# Patient Record
Sex: Female | Born: 1960 | Race: White | Hispanic: No | Marital: Married | Smoking: Never smoker
Health system: Southern US, Community
[De-identification: ages and names within clinical notes are randomized; demographics above are authoritative.]

## PROBLEM LIST (undated history)

## (undated) DIAGNOSIS — M1990 Unspecified osteoarthritis, unspecified site: Secondary | ICD-10-CM

## (undated) DIAGNOSIS — N189 Chronic kidney disease, unspecified: Secondary | ICD-10-CM

## (undated) DIAGNOSIS — T7840XA Allergy, unspecified, initial encounter: Secondary | ICD-10-CM

## (undated) DIAGNOSIS — E079 Disorder of thyroid, unspecified: Secondary | ICD-10-CM

## (undated) HISTORY — DX: Disorder of thyroid, unspecified: E07.9

## (undated) HISTORY — PX: THYROIDECTOMY: SHX17

## (undated) HISTORY — DX: Allergy, unspecified, initial encounter: T78.40XA

## (undated) HISTORY — PX: TOOTH EXTRACTION: SUR596

## (undated) HISTORY — DX: Chronic kidney disease, unspecified: N18.9

## (undated) HISTORY — DX: Unspecified osteoarthritis, unspecified site: M19.90

---

## 2015-04-27 DIAGNOSIS — M169 Osteoarthritis of hip, unspecified: Secondary | ICD-10-CM | POA: Insufficient documentation

## 2015-04-27 DIAGNOSIS — S91309A Unspecified open wound, unspecified foot, initial encounter: Secondary | ICD-10-CM | POA: Insufficient documentation

## 2015-12-19 DIAGNOSIS — L84 Corns and callosities: Secondary | ICD-10-CM | POA: Insufficient documentation

## 2018-10-24 DIAGNOSIS — E890 Postprocedural hypothyroidism: Secondary | ICD-10-CM | POA: Insufficient documentation

## 2018-10-29 ENCOUNTER — Other Ambulatory Visit: Payer: Self-pay | Admitting: Internal Medicine

## 2018-10-29 DIAGNOSIS — N289 Disorder of kidney and ureter, unspecified: Secondary | ICD-10-CM

## 2018-11-04 ENCOUNTER — Ambulatory Visit
Admission: RE | Admit: 2018-11-04 | Discharge: 2018-11-04 | Disposition: A | Discharge status: Home / Self Care | Payer: Medicare Other | Source: Ambulatory Visit | Attending: Internal Medicine | Admitting: Internal Medicine

## 2018-11-04 DIAGNOSIS — N289 Disorder of kidney and ureter, unspecified: Secondary | ICD-10-CM | POA: Insufficient documentation

## 2018-12-09 DIAGNOSIS — G8929 Other chronic pain: Secondary | ICD-10-CM | POA: Insufficient documentation

## 2018-12-09 DIAGNOSIS — M545 Low back pain, unspecified: Secondary | ICD-10-CM | POA: Insufficient documentation

## 2018-12-09 DIAGNOSIS — N183 Chronic kidney disease, stage 3 unspecified: Secondary | ICD-10-CM | POA: Insufficient documentation

## 2018-12-19 ENCOUNTER — Other Ambulatory Visit: Payer: Self-pay | Admitting: Physician Assistant

## 2018-12-19 DIAGNOSIS — Z1231 Encounter for screening mammogram for malignant neoplasm of breast: Secondary | ICD-10-CM

## 2019-01-07 DIAGNOSIS — M7630 Iliotibial band syndrome, unspecified leg: Secondary | ICD-10-CM | POA: Insufficient documentation

## 2019-01-09 ENCOUNTER — Emergency Department
Admission: EM | Admit: 2019-01-09 | Discharge: 2019-01-09 | Discharge status: Against Medical Advice | Payer: Medicare Other | Attending: Emergency Medicine | Admitting: Emergency Medicine

## 2019-01-09 ENCOUNTER — Other Ambulatory Visit: Payer: Self-pay

## 2019-01-09 ENCOUNTER — Encounter: Payer: Self-pay | Admitting: Emergency Medicine

## 2019-01-09 DIAGNOSIS — Z5321 Procedure and treatment not carried out due to patient leaving prior to being seen by health care provider: Secondary | ICD-10-CM | POA: Diagnosis not present

## 2019-01-09 DIAGNOSIS — R079 Chest pain, unspecified: Secondary | ICD-10-CM | POA: Insufficient documentation

## 2019-01-09 LAB — COMPREHENSIVE METABOLIC PANEL
ALT: 9 U/L (ref 0–44)
ANION GAP: 8 (ref 5–15)
AST: 19 U/L (ref 15–41)
Albumin: 4 g/dL (ref 3.5–5.0)
Alkaline Phosphatase: 65 U/L (ref 38–126)
BUN: 21 mg/dL — ABNORMAL HIGH (ref 6–20)
CO2: 28 mmol/L (ref 22–32)
Calcium: 9.1 mg/dL (ref 8.9–10.3)
Chloride: 102 mmol/L (ref 98–111)
Creatinine, Ser: 1.12 mg/dL — ABNORMAL HIGH (ref 0.44–1.00)
GFR calc Af Amer: >60 mL/min (ref >60)
GFR calc non Af Amer: 55 mL/min — ABNORMAL LOW (ref >60)
Glucose, Bld: 149 mg/dL — ABNORMAL HIGH (ref 70–99)
Potassium: 3.9 mmol/L (ref 3.5–5.1)
Sodium: 138 mmol/L (ref 135–145)
TOTAL PROTEIN: 7 g/dL (ref 6.5–8.1)
Total Bilirubin: 0.5 mg/dL (ref 0.3–1.2)

## 2019-01-09 LAB — CBC WITH DIFFERENTIAL/PLATELET
Abs Immature Granulocytes: 0.01 10*3/uL (ref 0.00–0.07)
BASOS ABS: 0.1 10*3/uL (ref 0.0–0.1)
Basophils Relative: 1 %
Eosinophils Absolute: 0 10*3/uL (ref 0.0–0.5)
Eosinophils Relative: 1 %
HEMATOCRIT: 37.6 % (ref 36.0–46.0)
Hemoglobin: 12.9 g/dL (ref 12.0–15.0)
Immature Granulocytes: 0 %
Lymphocytes Relative: 48 %
Lymphs Abs: 3.4 10*3/uL (ref 0.7–4.0)
MCH: 28 pg (ref 26.0–34.0)
MCHC: 34.3 g/dL (ref 30.0–36.0)
MCV: 81.6 fL (ref 80.0–100.0)
Monocytes Absolute: 0.5 10*3/uL (ref 0.1–1.0)
Monocytes Relative: 7 %
Neutro Abs: 2.9 10*3/uL (ref 1.7–7.7)
Neutrophils Relative %: 43 %
Platelets: 178 10*3/uL (ref 150–400)
RBC: 4.61 MIL/uL (ref 3.87–5.11)
RDW: 12.6 % (ref 11.5–15.5)
WBC: 6.9 10*3/uL (ref 4.0–10.5)
nRBC: 0 % (ref 0.0–0.2)

## 2019-01-09 LAB — TROPONIN I: Troponin I: <0.03 ng/mL (ref <0.03)

## 2019-01-09 NOTE — ED Notes (Signed)
Pt ambulatory to STAT desk without difficulty or distress noted; expresses desire to leave now and f/u in morning; informed of importance of rechecking labs for any changes and need to be eval by provider; pt st she lives "just around the corner" and will return for any new or worsening symptoms tonight but will call PCP in morning for f/u

## 2019-01-09 NOTE — ED Triage Notes (Signed)
Patient ambulatory to triage with steady gait, without difficulty or distress noted; pt reports left side CP that radiating into back; took 81mg  ASA that relieved CP but not back pain accomp by nausea; denies hx of same; seen at [facility] earlier for EKG, labs, and CXR and told needs a stress test

## 2019-09-02 ENCOUNTER — Ambulatory Visit
Admission: RE | Admit: 2019-09-02 | Discharge: 2019-09-02 | Disposition: A | Discharge status: Home / Self Care | Payer: Medicare Other | Source: Ambulatory Visit | Attending: Physician Assistant | Admitting: Physician Assistant

## 2019-09-02 DIAGNOSIS — Z1231 Encounter for screening mammogram for malignant neoplasm of breast: Secondary | ICD-10-CM | POA: Diagnosis not present

## 2020-08-09 ENCOUNTER — Other Ambulatory Visit: Payer: Self-pay | Admitting: Physician Assistant

## 2020-08-09 DIAGNOSIS — Z1231 Encounter for screening mammogram for malignant neoplasm of breast: Secondary | ICD-10-CM

## 2020-08-17 LAB — EXTERNAL GENERIC LAB PROCEDURE: COLOGUARD: NEGATIVE

## 2020-09-10 ENCOUNTER — Ambulatory Visit
Admission: RE | Admit: 2020-09-10 | Discharge: 2020-09-10 | Disposition: A | Discharge status: Home / Self Care | Payer: Medicare Other | Source: Ambulatory Visit | Attending: Physician Assistant | Admitting: Physician Assistant

## 2020-09-10 ENCOUNTER — Other Ambulatory Visit: Payer: Self-pay

## 2020-09-10 DIAGNOSIS — Z1231 Encounter for screening mammogram for malignant neoplasm of breast: Secondary | ICD-10-CM

## 2021-08-03 ENCOUNTER — Other Ambulatory Visit: Payer: Self-pay | Admitting: Physician Assistant

## 2021-08-03 DIAGNOSIS — Z1231 Encounter for screening mammogram for malignant neoplasm of breast: Secondary | ICD-10-CM

## 2021-12-14 ENCOUNTER — Ambulatory Visit
Admission: RE | Admit: 2021-12-14 | Discharge: 2021-12-14 | Disposition: A | Discharge status: Home / Self Care | Payer: Medicare Other | Source: Ambulatory Visit | Attending: Physician Assistant | Admitting: Physician Assistant

## 2021-12-14 ENCOUNTER — Other Ambulatory Visit: Payer: Self-pay

## 2021-12-14 DIAGNOSIS — Z1231 Encounter for screening mammogram for malignant neoplasm of breast: Secondary | ICD-10-CM | POA: Diagnosis not present

## 2022-08-08 ENCOUNTER — Other Ambulatory Visit: Payer: Self-pay | Admitting: Physician Assistant

## 2022-08-08 DIAGNOSIS — Z1231 Encounter for screening mammogram for malignant neoplasm of breast: Secondary | ICD-10-CM

## 2022-10-26 ENCOUNTER — Encounter: Payer: Self-pay | Admitting: Otolaryngology

## 2022-10-26 DIAGNOSIS — K118 Other diseases of salivary glands: Secondary | ICD-10-CM

## 2022-10-26 DIAGNOSIS — H9201 Otalgia, right ear: Secondary | ICD-10-CM

## 2022-10-27 ENCOUNTER — Other Ambulatory Visit: Payer: Self-pay | Admitting: Otolaryngology

## 2022-10-27 DIAGNOSIS — H9201 Otalgia, right ear: Secondary | ICD-10-CM

## 2022-10-27 DIAGNOSIS — K1120 Sialoadenitis, unspecified: Secondary | ICD-10-CM

## 2022-12-28 ENCOUNTER — Ambulatory Visit
Admission: RE | Admit: 2022-12-28 | Discharge: 2022-12-28 | Disposition: A | Discharge status: Home / Self Care | Payer: Medicare Other | Source: Ambulatory Visit | Attending: Physician Assistant | Admitting: Physician Assistant

## 2022-12-28 DIAGNOSIS — Z1231 Encounter for screening mammogram for malignant neoplasm of breast: Secondary | ICD-10-CM | POA: Diagnosis present

## 2023-02-26 IMAGING — MG MM DIGITAL SCREENING BILAT W/ TOMO AND CAD
8 series · 9 of 24 positions shown · non-contrast
Comparison: Previous exam(s).

CLINICAL DATA: Screening.

EXAM:
DIGITAL SCREENING BILATERAL MAMMOGRAM WITH TOMOSYNTHESIS AND CAD
TECHNIQUE: Bilateral screening digital craniocaudal and mediolateral oblique
mammograms were obtained. Bilateral screening digital breast
tomosynthesis was performed. The images were evaluated with
computer-aided detection.

[R CC synth-2D]
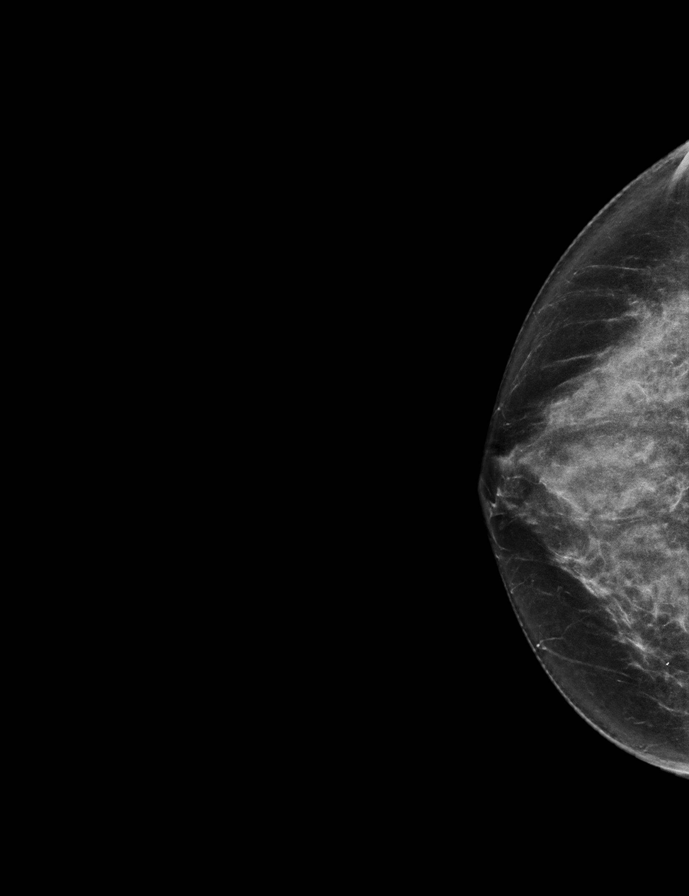

[R MLO synth-2D]
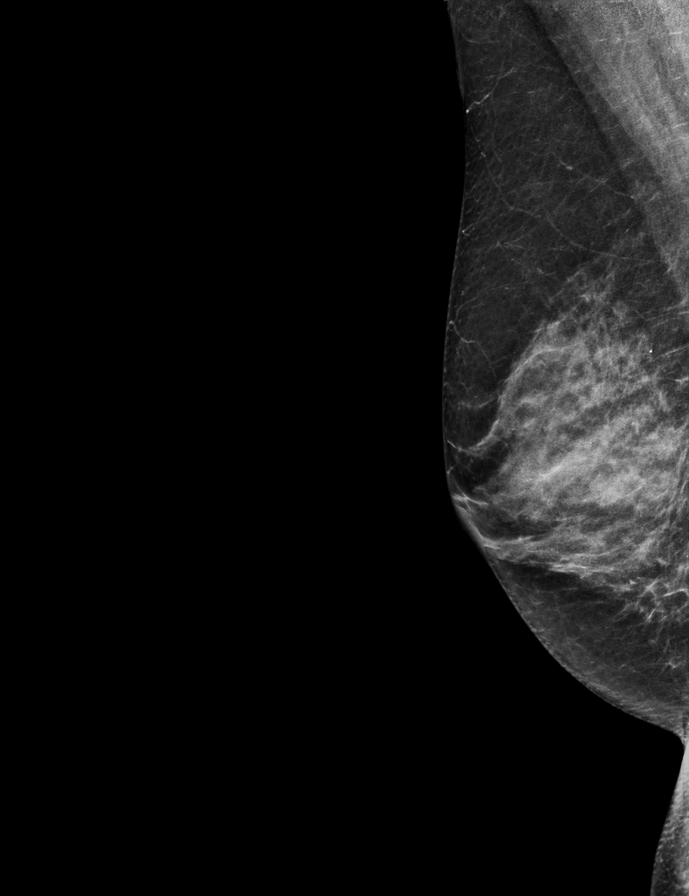

[L CC synth-2D]
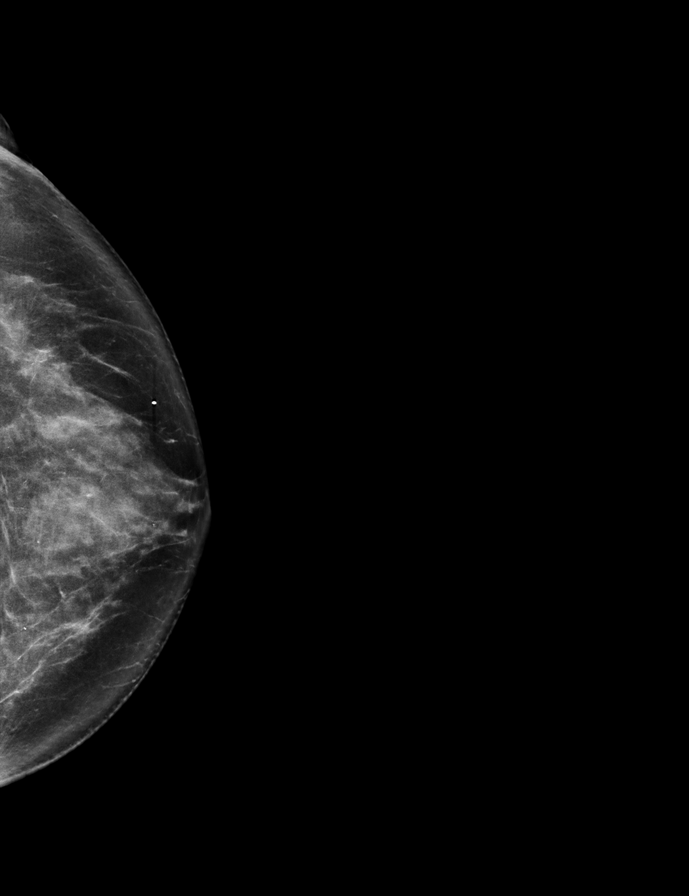

[L MLO synth-2D]
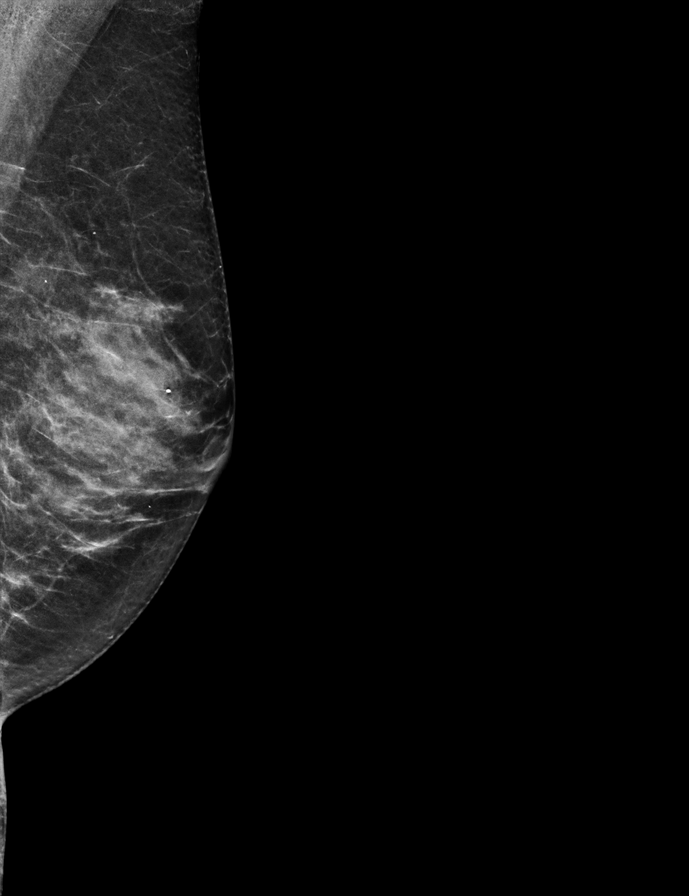

[L CC tomo · 2 of 73 frames shown]
[frame 24/73]
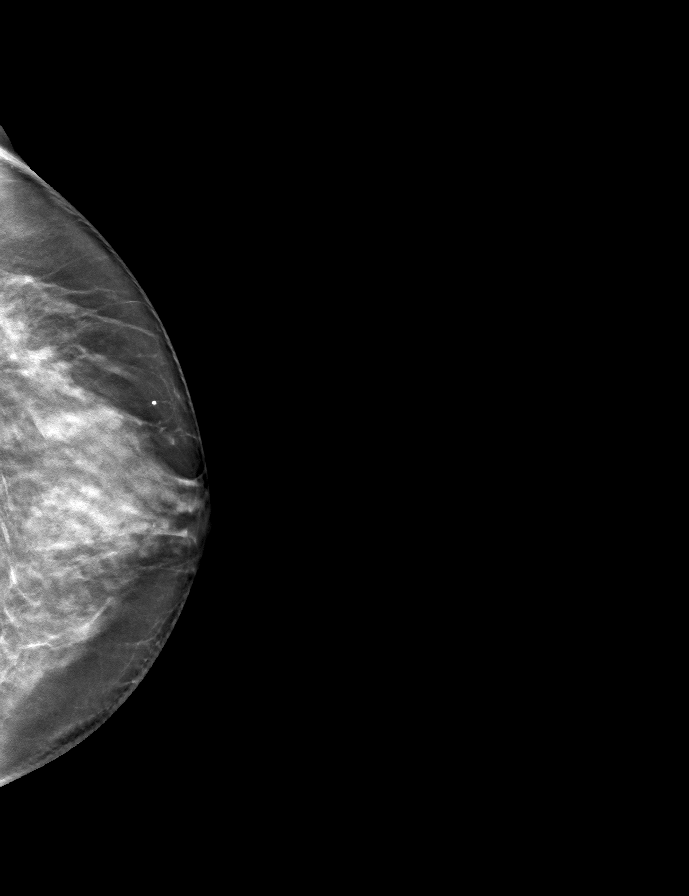
[frame 37/73]
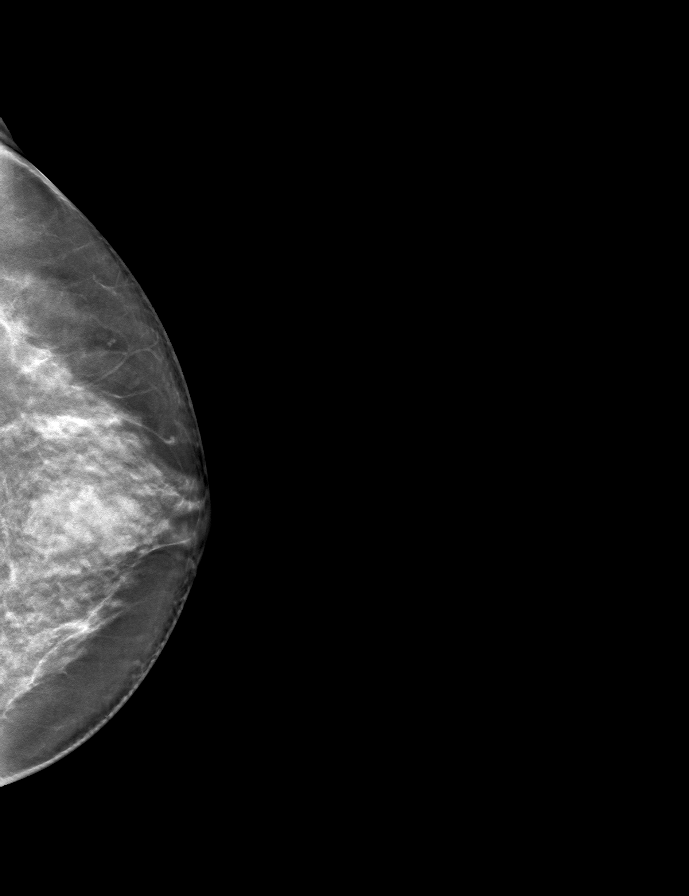

[L MLO tomo · tomo slice 30/59.0]
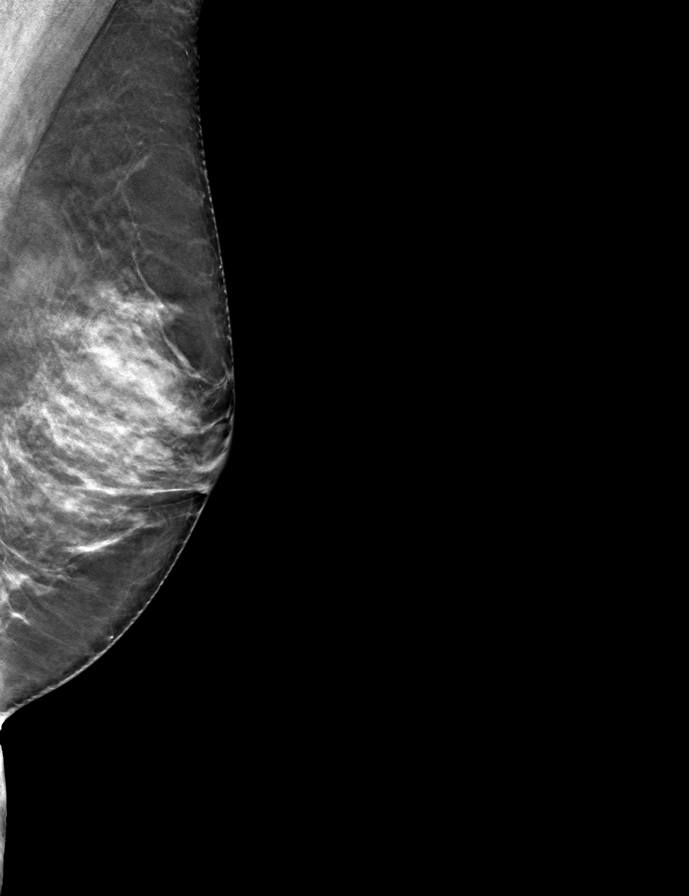

[R MLO tomo · tomo slice 31/60.0]
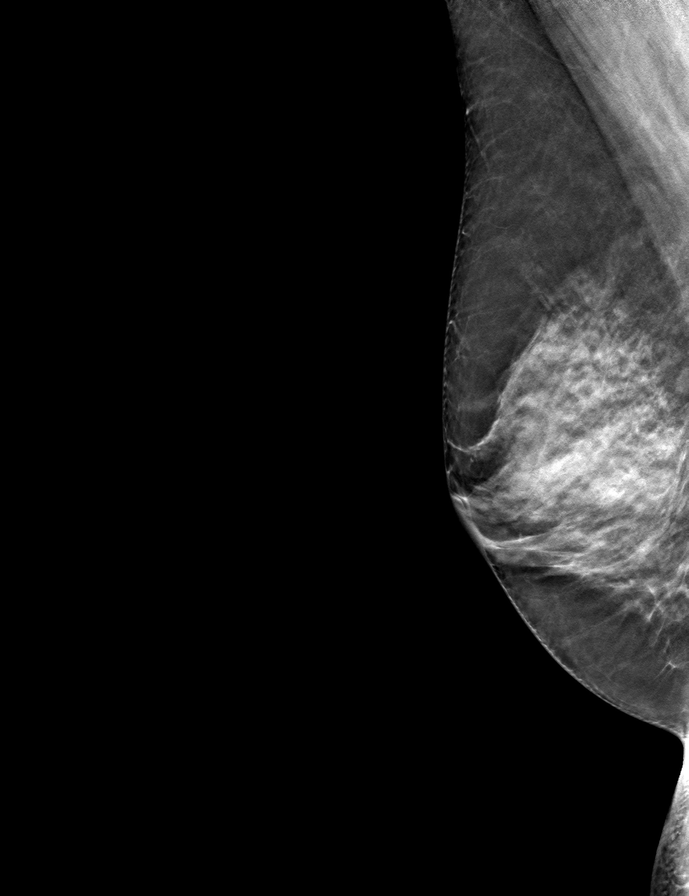

[R CC tomo · tomo slice 35/68.0]
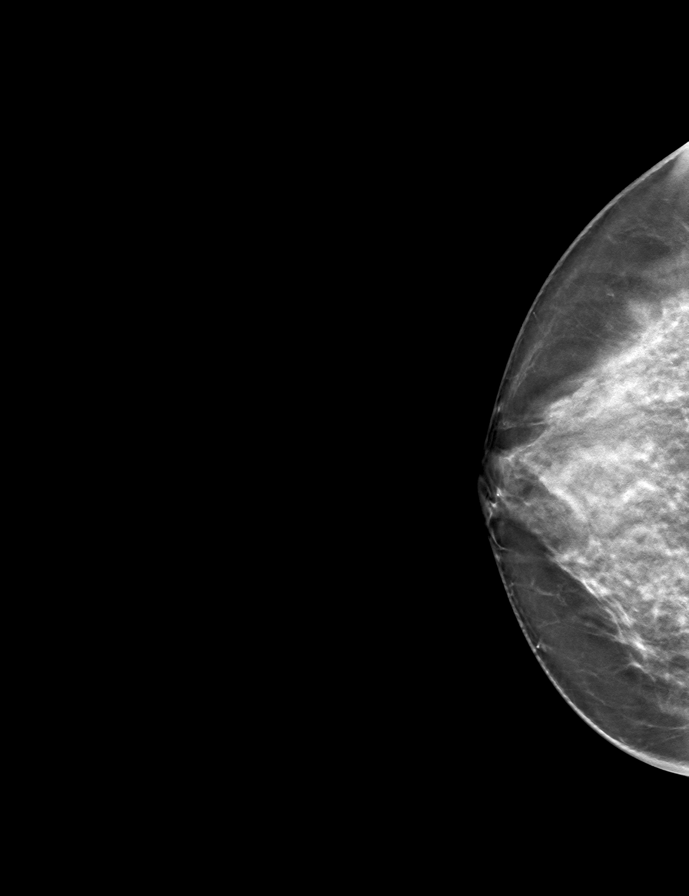

[9 of 24 positions shown; findings below may reference images not displayed]

ACR Breast Density Category d: The breast tissue is extremely dense,
which lowers the sensitivity of mammography
FINDINGS: There are no findings suspicious for malignancy.
IMPRESSION: No mammographic evidence of malignancy. A result letter of this
screening mammogram will be mailed directly to the patient.

RECOMMENDATION:
Screening mammogram in one year. (Code:TA-V-WV9)

BI-RADS CATEGORY  1: Negative.

## 2023-05-16 ENCOUNTER — Other Ambulatory Visit: Payer: Self-pay | Admitting: Neurology

## 2023-05-16 DIAGNOSIS — R519 Headache, unspecified: Secondary | ICD-10-CM

## 2023-05-26 ENCOUNTER — Ambulatory Visit
Admission: RE | Admit: 2023-05-26 | Discharge: 2023-05-26 | Disposition: A | Discharge status: Home / Self Care | Payer: Medicare Other | Source: Ambulatory Visit | Attending: Neurology | Admitting: Neurology

## 2023-05-26 DIAGNOSIS — R519 Headache, unspecified: Secondary | ICD-10-CM

## 2023-08-08 ENCOUNTER — Emergency Department
Admission: EM | Admit: 2023-08-08 | Discharge: 2023-08-09 | Disposition: A | Discharge status: Home / Self Care | Payer: Medicare Other | Attending: Emergency Medicine | Admitting: Emergency Medicine

## 2023-08-08 ENCOUNTER — Other Ambulatory Visit: Payer: Self-pay

## 2023-08-08 DIAGNOSIS — R300 Dysuria: Secondary | ICD-10-CM | POA: Insufficient documentation

## 2023-08-08 NOTE — ED Triage Notes (Signed)
Patient ambulatory to triage with complaints of burning when she pees. She states she was seen here 2 days ago but had negative UA. Patient has recently been on 2 different antibiotics after dental surgery.

## 2023-08-08 NOTE — ED Provider Notes (Signed)
[facility] Provider Note    Event Date/Time   First MD Initiated Contact with Patient 08/08/23 2328     (approximate)   History   Dysuria   HPI  Jamie Walker is a 62 y.o. female who presents to the ED for evaluation of Dysuria   Reviewed PCP visit from 10 days ago.  Seen for dental pain, diagnosed with dental abscess.  Previously prescribed amoxicillin from dentist but switched to Augmentin for 10 days, set to finish today.  Patient presents to the ED alongside her husband for evaluation of dysuria at the end of urination that occurred just tonight in the past hour or 2 with 1 void.  She reports she had urinary frequency 4 days ago transiently.  Regarding her Augmentin, she only took 3 doses and then stopped  No abdominal pain, emesis, fever, stool changes, vaginal discharge  Physical Exam   Triage Vital Signs: ED Triage Vitals  Encounter Vitals Group     BP 08/08/23 2324 (!) 174/92     Systolic BP Percentile --      Diastolic BP Percentile --      Pulse Rate 08/08/23 2324 85     Resp 08/08/23 2324 18     Temp 08/08/23 2324 97.6 F (36.4 C)     Temp src --      SpO2 08/08/23 2324 100 %     Weight 08/08/23 2323 116 lb (52.6 kg)     Height 08/08/23 2323 5\' 4"  (1.626 m)     Head Circumference --      Peak Flow --      Pain Score 08/08/23 2323 6     Pain Loc --      Pain Education --      Exclude from Growth Chart --     Most recent vital signs: Vitals:   08/08/23 2324  BP: (!) 174/92  Pulse: 85  Resp: 18  Temp: 97.6 F (36.4 C)  SpO2: 100%    General: Awake, no distress.  CV:  Good peripheral perfusion.  Resp:  Normal effort.  Abd:  No distention.  Soft and benign throughout MSK:  No deformity noted.  Neuro:  No focal deficits appreciated. Other:     ED Results / Procedures / Treatments   Labs (all labs ordered are listed, but only abnormal results are displayed) Labs Reviewed  URINALYSIS, ROUTINE W REFLEX  MICROSCOPIC - Abnormal; Notable for the following components:      Result Value   Color, Urine COLORLESS (*)    APPearance CLEAR (*)    Specific Gravity, Urine 1.002 (*)    Hgb urine dipstick SMALL (*)    All other components within normal limits  URINE CULTURE    EKG   RADIOLOGY   Official radiology report(s): No results found.  PROCEDURES and INTERVENTIONS:  Procedures  Medications  phenazopyridine (PYRIDIUM) tablet 100 mg (100 mg Oral Given 08/09/23 0041)     IMPRESSION / MDM / ASSESSMENT AND PLAN / ED COURSE  I reviewed the triage vital signs and the nursing notes.  Differential diagnosis includes, but is not limited to, atrophic vaginitis, STI, PID, UTI, retention  Patient presents after an episode of dysuria with out evidence of a UTI and suitable for outpatient management.  Urine is clear without stigmata of infection.  Doubt ureteral stone.  We discussed the possibility of vaginitis.  We discussed treatment with Pyridium, pending urine culture and PCP follow-up.  Discussed ED return  precautions.  She is suitable for outpatient management.      FINAL CLINICAL IMPRESSION(S) / ED DIAGNOSES   Final diagnoses:  Dysuria     Rx / DC Orders   ED Discharge Orders     None        Note:  This document was prepared using Dragon voice recognition software and may include unintentional dictation errors.   Delton Prairie, MD 08/09/23 [phone]

## 2023-08-09 DIAGNOSIS — R300 Dysuria: Secondary | ICD-10-CM | POA: Diagnosis not present

## 2023-08-09 LAB — URINALYSIS, ROUTINE W REFLEX MICROSCOPIC
Bacteria, UA: NONE SEEN
Bilirubin Urine: NEGATIVE
Glucose, UA: NEGATIVE mg/dL
Ketones, ur: NEGATIVE mg/dL
Leukocytes,Ua: NEGATIVE
Nitrite: NEGATIVE
Protein, ur: NEGATIVE mg/dL
Specific Gravity, Urine: 1.002 — ABNORMAL LOW (ref 1.005–1.030)
Squamous Epithelial / HPF: 0 /[HPF] (ref 0–5)
pH: 6 (ref 5.0–8.0)

## 2023-08-09 MED ORDER — PHENAZOPYRIDINE HCL 100 MG PO TABS
95.0000 mg | ORAL_TABLET | Freq: Once | ORAL | Status: AC
  Administered 2023-08-09: 100 mg via ORAL
  Filled 2023-08-09: qty 1

## 2023-08-09 NOTE — ED Notes (Signed)
Patient complaining of burning when voiding.  Ambulated to the restroom independently.  Husband at the bedside.

## 2023-08-10 LAB — URINE CULTURE: Culture: <10000 — AB

## 2023-08-25 ENCOUNTER — Emergency Department
Admission: EM | Admit: 2023-08-25 | Discharge: 2023-08-25 | Disposition: A | Discharge status: Home / Self Care | Payer: Medicare Other | Attending: Emergency Medicine | Admitting: Emergency Medicine

## 2023-08-25 ENCOUNTER — Other Ambulatory Visit: Payer: Self-pay

## 2023-08-25 DIAGNOSIS — G500 Trigeminal neuralgia: Secondary | ICD-10-CM | POA: Diagnosis not present

## 2023-08-25 DIAGNOSIS — R519 Headache, unspecified: Secondary | ICD-10-CM | POA: Diagnosis present

## 2023-08-25 MED ORDER — KETOROLAC TROMETHAMINE 30 MG/ML IJ SOLN
30.0000 mg | Freq: Once | INTRAMUSCULAR | Status: AC
  Administered 2023-08-25: 30 mg via INTRAVENOUS
  Filled 2023-08-25: qty 1

## 2023-08-25 MED ORDER — CELECOXIB 100 MG PO CAPS: 100.0000 mg | ORAL_CAPSULE | Freq: Two times a day (BID) | ORAL | 2 refills | Status: DC

## 2023-08-25 MED ORDER — ONDANSETRON HCL 4 MG/2ML IJ SOLN
4.0000 mg | Freq: Once | INTRAMUSCULAR | Status: AC
  Administered 2023-08-25: 4 mg via INTRAVENOUS
  Filled 2023-08-25: qty 2

## 2023-08-25 MED ORDER — DIPHENHYDRAMINE HCL 50 MG/ML IJ SOLN
25.0000 mg | Freq: Once | INTRAMUSCULAR | Status: AC
  Administered 2023-08-25: 25 mg via INTRAVENOUS
  Filled 2023-08-25: qty 1

## 2023-08-25 MED ORDER — TRAMADOL HCL 50 MG PO TABS: 50.0000 mg | ORAL_TABLET | Freq: Four times a day (QID) | ORAL | 0 refills | Status: DC | PRN

## 2023-08-25 MED ORDER — MORPHINE SULFATE (PF) 4 MG/ML IV SOLN
4.0000 mg | Freq: Once | INTRAVENOUS | Status: AC
  Administered 2023-08-25: 4 mg via INTRAVENOUS
  Filled 2023-08-25: qty 1

## 2023-08-25 NOTE — ED Provider Notes (Signed)
[facility] Provider Note    Event Date/Time   First MD Initiated Contact with Patient 08/25/23 [phone]     (approximate)   History   Dental Pain   HPI  Jamie Walker is a 62 y.o. female with fairly extensive history of right sided facial pain complicated by extraction performed several weeks ago which seem to have helped initial pain but is created a new ongoing pain.  She has seen multiple specialist for this including 2 oral surgeons who have recommended pain management     Physical Exam   Triage Vital Signs: ED Triage Vitals [08/25/23 0723]  Encounter Vitals Group     BP (!) 172/94     Systolic BP Percentile      Diastolic BP Percentile      Pulse Rate (!) 113     Resp 18     Temp 97.8 F (36.6 C)     Temp Source Oral     SpO2 99 %     Weight 52.6 kg (115 lb 15.4 oz)     Height 1.626 m (5\' 4" )     Head Circumference      Peak Flow      Pain Score 10     Pain Loc      Pain Education      Exclude from Growth Chart     Most recent vital signs: Vitals:   08/25/23 0723  BP: (!) 172/94  Pulse: (!) 113  Resp: 18  Temp: 97.8 F (36.6 C)  SpO2: 99%     General: Awake, no distress.  CV:  Good peripheral perfusion.  Resp:  Normal effort.  Abd:  No distention.  Other:  She describes point tenderness, posterior right mandible, no swelling or redness or signs of infection   ED Results / Procedures / Treatments   Labs (all labs ordered are listed, but only abnormal results are displayed) Labs Reviewed - No data to display   EKG     RADIOLOGY     PROCEDURES:  Critical Care performed:   Procedures   MEDICATIONS ORDERED IN ED: Medications  diphenhydrAMINE (BENADRYL) injection 25 mg (has no administration in time range)  morphine (PF) 4 MG/ML injection 4 mg (4 mg Intravenous Given 08/25/23 0814)  ketorolac (TORADOL) 30 MG/ML injection 30 mg (30 mg Intravenous Given 08/25/23 0817)  ondansetron (ZOFRAN) injection 4 mg (4  mg Intravenous Given 08/25/23 0816)     IMPRESSION / MDM / ASSESSMENT AND PLAN / ED COURSE  I reviewed the triage vital signs and the nursing notes. Patient's presentation is most consistent with exacerbation of chronic illness.  Patient with ongoing facial pain, has been worked up for trigeminal neuralgia, dry socket, unclear cause of her symptoms.  Will try to provide temporary analgesia here with IV morphine, IV Toradol, IV Zofran  ----------------------------------------- 9:00 AM on 08/25/2023 ----------------------------------------- Patient is reporting abdominal discomfort, suspect this is related to the morphine, she has received Zofran  ----------------------------------------- 9:04 AM on 08/25/2023 ----------------------------------------- She now reports increased nasal congestion.  I doubt this is an allergic reaction possibly more related to significant anxiety that the patient has.  Regardless we will give a dose of IV Benadryl and continue to monitor  ----------------------------------------- 9:14 AM on 08/25/2023 ----------------------------------------- Patient initially refused the Benadryl, reevaluated the patient she is primarily complaining of abdominal cramping, this is likely a side effect of the morphine, she is agreed to take a dose of IV Benadryl to see  if this helps  ----------------------------------------- 9:38 AM on 08/25/2023 ----------------------------------------- Patient feeling better after Benadryl, she states she is ready for discharge, will trial tramadol and Celebrex, pain management referral placed      FINAL CLINICAL IMPRESSION(S) / ED DIAGNOSES   Final diagnoses:  Facial pain syndrome     Rx / DC Orders   ED Discharge Orders          Ordered    Ambulatory referral to Pain Clinic        08/25/23 0837    traMADol (ULTRAM) 50 MG tablet  Every 6 hours PRN        08/25/23 0838    celecoxib (CELEBREX) 100 MG capsule  2 times daily         08/25/23 7846             Note:  This document was prepared using Dragon voice recognition software and may include unintentional dictation errors.   Jene Every, MD 08/25/23 [phone]

## 2023-08-25 NOTE — ED Triage Notes (Signed)
Pt here with dental pain. Pt states she has had 2 teeth removed 3 weeks ago and now she is having severe pain. Pt was told that she had to go to the oral surgeon but they are unable to find the cause of her pain. Pt state has not been able to see a neurologist. Pt received a CT scan at [facility] recently.

## 2023-08-25 NOTE — Discharge Instructions (Addendum)
I have placed a referral to pain management

## 2023-09-03 ENCOUNTER — Encounter: Payer: Self-pay | Admitting: Urology

## 2023-09-03 ENCOUNTER — Ambulatory Visit (INDEPENDENT_AMBULATORY_CARE_PROVIDER_SITE_OTHER): Payer: Medicare Other | Admitting: Urology

## 2023-09-03 VITALS — BP 150/87 | HR 123 | Ht 64.0 in | Wt 111.0 lb

## 2023-09-03 DIAGNOSIS — R102 Pelvic and perineal pain: Secondary | ICD-10-CM | POA: Diagnosis not present

## 2023-09-03 DIAGNOSIS — R300 Dysuria: Secondary | ICD-10-CM

## 2023-09-03 DIAGNOSIS — R8281 Pyuria: Secondary | ICD-10-CM | POA: Diagnosis not present

## 2023-09-03 LAB — URINALYSIS, COMPLETE
Bilirubin, UA: NEGATIVE
Glucose, UA: NEGATIVE
Nitrite, UA: NEGATIVE
Protein,UA: NEGATIVE
Specific Gravity, UA: 1.01 (ref 1.005–1.030)
Urobilinogen, Ur: 0.2 mg/dL (ref 0.2–1.0)
pH, UA: 6 (ref 5.0–7.5)

## 2023-09-03 LAB — MICROSCOPIC EXAMINATION

## 2023-09-03 MED ORDER — GEMTESA 75 MG PO TABS: 75.0000 mg | ORAL_TABLET | Freq: Every day | ORAL | Status: DC

## 2023-09-03 NOTE — Progress Notes (Signed)
I, Jamie Walker, acting as a scribe for Riki Altes, MD., have documented all relevant documentation on the behalf of Riki Altes, MD, as directed by Riki Altes, MD while in the presence of Riki Altes, MD.  09/03/2023 9:59 AM   Jamie Walker 1961-10-07 [id number]  Referring provider: Patrice Paradise, MD [address] [facility] [place]  [place] [address]  Chief Complaint  Patient presents with   Dysuria    HPI: Jamie Walker is a 62 y.o. female referred for evaluation of pelvic pain.  Has had a persistent dental infection and was switched from amoxicillin to Augmentin. After starting Augmentin, she had significant frequency, urinating every 5 minutes with what she described as very clear urine. This was associated with bladder spasms and urgency. She subsequently developed dysuria, which has improved, however presently has a sensation of urethral irritation. She has had both a non-contrast and contrast CTs, one showing mild stranding of the bladder. She has had several negative urine cultures.  She was seen at [facility]rology [facility] on 08/17/23 and urinalysis at that visit was a negative. Pelvic floor physical therapy was discussed as well as bladder antispasmodics and cystoscopy.  She states the symptoms resolved after stopping the amoxicillin, however she has had significant pain in taking hydrocodone, which is causing the urethral irritation and sensation of a UTI. She states when she stopped the hydrocodone, the symptoms resolved, but due to her significant pain, has been unable to discontinue the hydrocodone.  No previous history of urologic problems.  Denies gross hematuria.    Surgical History: Past Surgical History:  Procedure Laterality Date   THYROIDECTOMY      Home Medications:  Allergies as of 09/03/2023       Reactions   Wasp Venom Anaphylaxis   Wasp Venom Protein Other (See Comments)   Amoxicillin-pot Clavulanate Other  (See Comments)   Bladder problems   Avocado Other (See Comments)   Piper Other (See Comments)        Medication List        Accurate as of September 03, 2023  9:59 AM. If you have any questions, ask your nurse or doctor.          celecoxib 100 MG capsule Commonly known as: CeleBREX Take 1 capsule (100 mg total) by mouth 2 (two) times daily.   cephALEXin 250 MG capsule Commonly known as: KEFLEX Take by mouth 4 (four) times daily.   gabapentin 100 MG capsule Commonly known as: NEURONTIN Take by mouth.   Gemtesa 75 MG Tabs Generic drug: Vibegron Take 1 tablet (75 mg total) by mouth daily. Started by: Riki Altes   HYDROcodone-acetaminophen 5-325 MG tablet Commonly known as: NORCO/VICODIN Take 1 tablet by mouth every 6 (six) hours as needed.   Synthroid 75 MCG tablet Generic drug: levothyroxine   traMADol 50 MG tablet Commonly known as: Ultram Take 1 tablet (50 mg total) by mouth every 6 (six) hours as needed.        Allergies:  Allergies  Allergen Reactions   Wasp Venom Anaphylaxis   Wasp Venom Protein Other (See Comments)   Amoxicillin-Pot Clavulanate Other (See Comments)    Bladder problems   Avocado Other (See Comments)   Piper Other (See Comments)    Social History:  reports that she has never smoked. She has never used smokeless tobacco. No history on file for alcohol use and drug use.   Physical Exam: BP (!) 150/87  Pulse (!) 123   Ht 5\' 4"  (1.626 m)   Wt 111 lb (50.3 kg)   BMI 19.05 kg/m   Constitutional:  Alert, No acute distress. HEENT:  AT Respiratory: Normal respiratory effort, no increased work of breathing. Psychiatric: Normal mood and affect.   Urinalysis Dipstick trace leukocyte/trace blood, microscopy 6-10 WBC.    Pertinent Imaging: CTs were personally reviewed and interpreted.  CT  EXAM: CT ABDOMEN PELVIS W CONTRAST ACCESSION: [id number] UN CLINICAL INDICATION: 62 years old with lower abdominal pain     COMPARISON: None  TECHNIQUE: A helical CT scan of the abdomen and pelvis was obtained following IV contrast from the lung bases through the pubic symphysis. Images were reconstructed in the axial plane. Coronal and sagittal reformatted images were also provided for further evaluation.   FINDINGS:  LOWER CHEST: Unremarkable.  LIVER: Normal liver contour. Focal fat along the falciform ligament. 1.4 cm cyst in the right lobe of the liver.  BILIARY: Cholelithiasis. No biliary ductal dilatation.    SPLEEN: Splenomegaly  PANCREAS: Normal pancreatic contour.  No focal lesions.  No ductal dilation.  ADRENAL GLANDS: Normal appearance of the adrenal glands.  KIDNEYS/URETERS: Symmetric renal enhancement.  No hydronephrosis.  No solid renal mass.  BLADDER: Mild stranding of the bladder.  REPRODUCTIVE ORGANS: The uterus is present.  GI TRACT: The stomach appears unremarkable. The loops of small bowel are normal in caliber. No evidence of acute colonic pathology. The appendix appears unremarkable.  PERITONEUM, RETROPERITONEUM AND MESENTERY: No free air. Small volume pelvic free fluid. No fluid collection.  LYMPH NODES: No adenopathy.  VESSELS: Hepatic and portal veins are patent. Mild calcified atherosclerotic disease.  BONES and SOFT TISSUES: No aggressive osseous lesions.  No focal soft tissue lesions.   IMPRESSION: Mild stranding of the urinary bladder which can be seen with cystitis in the correct clinical setting.  Nonspecific splenomegaly.  Small volume pelvic free fluid. Exam End: 08/16/23 22:23    CT Indication: hematuria, R30.0 Dysuria, R31.9 Hematuria, unspecified.  Technique:  CT imaging from the level of the kidneys to the pelvis was performed without intravenous or oral contrast. The patient was scanned in the prone position. Coronal and sagittal reformatted images were generated and reviewed to assist with anatomic localization and lesion detection.  Findings:    Visualized lung bases are clear.  The unenhanced liver demonstrates a cystic lesion in the central liver. Cholelithiasis. No biliary ductal dilatation. The spleen measures upper normal at 12 cm in craniocaudal axis. The pancreas, adrenal glands are normal.  No urolithiasis or hydronephrosis. Calcifications adjacent to the distal left ureter measure up to 0.3 cm and may represent phleboliths and less likely nonobstructing ureteral calculi.  No bowel wall thickening or dilatation. Normal appendix. No mesenteric or retroperitoneal lymphadenopathy. Aorta is normal in course and caliber.  Small free fluid in the pelvis. No pelvic masses or lymphadenopathy.  No acute osseous abnormality.   Impression: Tiny calcifications in the region of the distal left ureter could potentially represent nonobstructing ureteral calculi or phleboliths. No hydronephrosis.  Electronically Signed by:  Morton Stall, MD, [facility] Electronically Signed on:  08/12/2023 12:53 PM   Assessment & Plan:    1. Pelvic pain Present symptoms appear related to hydrocodone as she states they resolve when she stops the medication.  She has an appointment with an oral surgeon tomorrow and is hoping her dental problems will improve.  Was given a sample of Gemtesa to try to see if this does improve her symptoms.  If she has persistent symptoms after discontinuing hydrocodone recommend cystoscopy Mild pyuria on urinalysis today and a urine culture was ordered.   I have reviewed the above documentation for accuracy and completeness, and I agree with the above.   Riki Altes, MD  [facility] [address] [place], [place] [address] [phone]

## 2023-09-04 ENCOUNTER — Encounter: Payer: Self-pay | Admitting: Urology

## 2023-09-05 ENCOUNTER — Inpatient Hospital Stay
Admission: RE | Admit: 2023-09-05 | Discharge: 2023-09-05 | Disposition: A | Discharge status: Home / Self Care | Payer: Self-pay | Source: Ambulatory Visit | Attending: Neurosurgery | Admitting: Neurosurgery

## 2023-09-05 ENCOUNTER — Other Ambulatory Visit: Payer: Self-pay | Admitting: Family Medicine

## 2023-09-05 DIAGNOSIS — Z049 Encounter for examination and observation for unspecified reason: Secondary | ICD-10-CM

## 2023-09-06 LAB — CULTURE, URINE COMPREHENSIVE

## 2023-09-07 NOTE — Progress Notes (Unsigned)
Referring Physician:  Bud Face, MD [address] [place],  [place] [address]  Primary Physician:  Patrice Paradise, MD  History of Present Illness: 09/11/2023 Jamie Walker is here today with a chief complaint of right-sided facial pain.  She had an initial episode of pain into her right lower jaw in late 2023.  She was diagnosed with trigeminal neuralgia and started on a steroid taper.  Her pain then resolved.  Over the past several months, she has also had pain into her right upper jaw.  She had a dental procedure performed approxi-1 month ago and has been in incapacitating pain since that time.  She has another procedure planned for tomorrow.  Nothing really helps or makes it worse.  It has been constant and is life altering.  Her pain is 10 out of 10.  She does not have any numbness.  She sometimes has radiation of her pain across the midline to the left side of her face near the corner of her mouth.  However, most of her pain is localized to the site where she had her prior dental procedure performed.  She previously tried carbamazepine for 2 days but was unable to tolerate the medication.  It did not help her pain.  Bowel/Bladder Dysfunction: none  The symptoms are causing a significant impact on the patient's life.   I have utilized the care everywhere function in epic to review the outside records available from external health systems.  Review of Systems:  A 10 point review of systems is negative, except for the pertinent positives and negatives detailed in the HPI.  Past Medical History: History reviewed. No pertinent past medical history.  Past Surgical History: Past Surgical History:  Procedure Laterality Date   THYROIDECTOMY     TOOTH EXTRACTION      Allergies: Allergies as of 09/11/2023 - Review Complete 09/11/2023  Allergen Reaction Noted   Wasp venom Anaphylaxis 10/24/2018   Wasp venom protein Other (See Comments)  09/03/2023   Amoxicillin-pot clavulanate Other (See Comments) 08/16/2023   Avocado Other (See Comments) 10/24/2018   Morphine Nausea And Vomiting 09/11/2023   Piper Other (See Comments) 10/24/2018    Medications:  Current Outpatient Medications:    Cholecalciferol (VITAMIN D-1000 MAX ST) 25 MCG (1000 UT) tablet, Take 1,000 Units by mouth daily., Disp: , Rfl:    EPINEPHrine 0.3 mg/0.3 mL IJ SOAJ injection, Inject 0.3 mLs into the muscle as needed., Disp: , Rfl:    HYDROcodone-acetaminophen (NORCO/VICODIN) 5-325 MG tablet, Take 1 tablet by mouth every 6 (six) hours as needed., Disp: , Rfl:    SYNTHROID 75 MCG tablet, , Disp: , Rfl:   Social History: Social History   Tobacco Use   Smoking status: Never   Smokeless tobacco: Never    Family Medical History: History reviewed. No pertinent family history.  Physical Examination: Vitals:   09/11/23 0958  BP: 136/88    General: Patient is in obvious pain. Attention to examination is appropriate.  Neck:   Supple.  Full range of motion.  Respiratory: Patient is breathing without any difficulty.   NEUROLOGICAL:     Awake, alert, oriented to person, place, and time.  Speech is clear and fluent.   Cranial Nerves: Pupils equal round and reactive to light.  Facial tone is symmetric.  Facial sensation is symmetric. Shoulder shrug is symmetric. Tongue protrusion is midline.  There is no pronator drift.  Strength: Side Biceps Triceps Deltoid Interossei Grip Wrist Ext.  Wrist Flex.  R 5 5 5 5 5 5 5   L 5 5 5 5 5 5 5    Side Iliopsoas Quads Hamstring PF DF EHL  R 5 5 5 5 5 5   L 5 5 5 5 5 5    Reflexes are 1+ and symmetric at the biceps, triceps, brachioradialis, patella and achilles.   Hoffman's is absent.   Bilateral upper and lower extremity sensation is intact to light touch.    No evidence of dysmetria noted.  Gait is normal.     Medical Decision Making  Imaging: MRI Brain 06/05/2023 IMPRESSION: 1. The patient does have a  vessel adjacent to the root entry zone of the right fifth nerve. This could possibly relate to the patient's symptoms. No other regional finding to explain the presenting symptoms. 2. No visible active dental disease or bone lesion of the maxilla or mandible. Several right mandibular tooth extractions in the past. Question old healed right mandibular fracture.     Electronically Signed   By: Paulina Fusi M.D.   On: 06/05/2023 15:28  I have personally reviewed the images and agree with the above interpretation.  Assessment and Plan: [name] is a pleasant 62 y.o. female with atypical facial pain.  She may have had a bout of trigeminal neuralgia last year that has resolved.  However, her pain now is localized to the area of her prior procedure.  She did not have a good response to carbamazepine, but only took the medication for 2 days.  As such, I do not think that this constitutes a failure of the medication.  That being said, her current pain does not seem consistent with trigeminal neuralgia.  She is having an additional intervention on her oral cavity tomorrow.  I am hopeful that this will help with her pain.  If it does not, I will reach out to my former colleague Dr. Lenord Fellers to see if she may benefit from balloon rhizotomy or some other procedure.  At this point, I think it would be difficult to support a microvascular decompression.  We did review that narcotics typically do not help with nerve related pain in this area.  However pain relief is usually focused on neuropathic pain medications such as gabapentin, carbamazepine, and other related medications.  I spent a total of 30 minutes in this patient's care today. This time was spent reviewing pertinent records including imaging studies, obtaining and confirming history, performing a directed evaluation, formulating and discussing my recommendations, and documenting the visit within the medical record.     Thank you for involving me  in the care of this patient.      Orianna Biskup K. Myer Haff MD, [facility]

## 2023-09-11 ENCOUNTER — Encounter: Payer: Self-pay | Admitting: Neurosurgery

## 2023-09-11 ENCOUNTER — Ambulatory Visit (INDEPENDENT_AMBULATORY_CARE_PROVIDER_SITE_OTHER): Payer: Medicare Other | Admitting: Neurosurgery

## 2023-09-11 VITALS — BP 136/88 | Ht 64.0 in | Wt 112.0 lb

## 2023-09-11 DIAGNOSIS — G501 Atypical facial pain: Secondary | ICD-10-CM

## 2023-09-11 DIAGNOSIS — G500 Trigeminal neuralgia: Secondary | ICD-10-CM | POA: Diagnosis not present

## 2023-09-27 DIAGNOSIS — M899 Disorder of bone, unspecified: Secondary | ICD-10-CM | POA: Insufficient documentation

## 2023-09-27 DIAGNOSIS — Z79891 Long term (current) use of opiate analgesic: Secondary | ICD-10-CM | POA: Insufficient documentation

## 2023-09-27 DIAGNOSIS — Z79899 Other long term (current) drug therapy: Secondary | ICD-10-CM | POA: Insufficient documentation

## 2023-09-27 DIAGNOSIS — Z789 Other specified health status: Secondary | ICD-10-CM | POA: Insufficient documentation

## 2023-09-27 DIAGNOSIS — G894 Chronic pain syndrome: Secondary | ICD-10-CM | POA: Insufficient documentation

## 2023-09-27 NOTE — Patient Instructions (Addendum)
______________________________________________________________________    Opioid Pain Medication Update  To: All patients taking opioid pain medications. (I.e.: hydrocodone, hydromorphone, oxycodone, oxymorphone, morphine, codeine, methadone, tapentadol, tramadol, buprenorphine, fentanyl, etc.)  Re: Updated review of side effects and adverse reactions of opioid analgesics, as well as new information about long term effects of this class of medications.  Direct risks of long-term opioid therapy are not limited to opioid addiction and overdose. Potential medical risks include serious fractures, breathing problems during sleep, hyperalgesia, immunosuppression, chronic constipation, bowel obstruction, myocardial infarction, and tooth decay secondary to xerostomia.  Unpredictable adverse effects that can occur even if you take your medication correctly: Cognitive impairment, respiratory depression, and death. Most people think that if they take their medication "correctly", and "as instructed", that they will be safe. Nothing could be farther from the truth. In reality, a significant amount of recorded deaths associated with the use of opioids has occurred in individuals that had taken the medication for a long time, and were taking their medication correctly. The following are examples of how this can happen: Patient taking his/her medication for a long time, as instructed, without any side effects, is given a certain antibiotic or another unrelated medication, which in turn triggers a "Drug-to-drug interaction" leading to disorientation, cognitive impairment, impaired reflexes, respiratory depression or an untoward event leading to serious bodily harm or injury, including death.  Patient taking his/her medication for a long time, as instructed, without any side effects, develops an acute impairment of liver and/or kidney function. This will lead to a rapid inability of the body to breakdown and eliminate  their pain medication, which will result in effects similar to an "overdose", but with the same medicine and dose that they had always taken. This again may lead to disorientation, cognitive impairment, impaired reflexes, respiratory depression or an untoward event leading to serious bodily harm or injury, including death.  A similar problem will occur with patients as they grow older and their liver and kidney function begins to decrease as part of the aging process.  Background information: Historically, the original case for using long-term opioid therapy to treat chronic noncancer pain was based on safety assumptions that subsequent experience has called into question. In 1996, the American Pain Society and the American Academy of Pain Medicine issued a consensus statement supporting long-term opioid therapy. This statement acknowledged the dangers of opioid prescribing but concluded that the risk for addiction was low; respiratory depression induced by opioids was short-lived, occurred mainly in opioid-naive patients, and was antagonized by pain; tolerance was not a common problem; and efforts to control diversion should not constrain opioid prescribing. This has now proven to be wrong. Experience regarding the risks for opioid addiction, misuse, and overdose in community practice has failed to support these assumptions.  According to the Centers for Disease Control and Prevention, fatal overdoses involving opioid analgesics have increased sharply over the past decade. Currently, more than 96,700 people die from drug overdoses every year. Opioids are a factor in 7 out of every 10 overdose deaths. Deaths from drug overdose have surpassed motor vehicle accidents as the leading cause of death for individuals between the ages of 9 and 12.  Clinical data suggest that neuroendocrine dysfunction may be very common in both men and women, potentially causing hypogonadism, erectile dysfunction, infertility,  decreased libido, osteoporosis, and depression. Recent studies linked higher opioid dose to increased opioid-related mortality. Controlled observational studies reported that long-term opioid therapy may be associated with increased risk for cardiovascular events. Subsequent  meta-analysis concluded that the safety of long-term opioid therapy in elderly patients has not been proven.   Side Effects and adverse reactions: Common side effects: Drowsiness (sedation). Dizziness. Nausea and vomiting. Constipation. Physical dependence -- Dependence often manifests with withdrawal symptoms when opioids are discontinued or decreased. Tolerance -- As you take repeated doses of opioids, you require increased medication to experience the same effect of pain relief. Respiratory depression -- This can occur in healthy people, especially with higher doses. However, people with COPD, asthma or other lung conditions may be even more susceptible to fatal respiratory impairment.  Uncommon side effects: An increased sensitivity to feeling pain and extreme response to pain (hyperalgesia). Chronic use of opioids can lead to this. Delayed gastric emptying (the process by which the contents of your stomach are moved into your small intestine). Muscle rigidity. Immune system and hormonal dysfunction. Quick, involuntary muscle jerks (myoclonus). Arrhythmia. Itchy skin (pruritus). Dry mouth (xerostomia).  Long-term side effects: Chronic constipation. Sleep-disordered breathing (SDB). Increased risk of bone fractures. Hypothalamic-pituitary-adrenal dysregulation. Increased risk of overdose.  RISKS: Respiratory depression and death: Opioids increase the risk of respiratory depression and death.  Drug-to-drug interactions: Opioids are relatively contraindicated in combination with benzodiazepines, sleep inducers, and other central nervous system depressants. Other classes of medications (i.e.: certain antibiotics  and even over-the-counter medications) may also trigger or induce respiratory depression in some patients.  Medical conditions: Patients with pre-existing respiratory problems are at higher risk of respiratory failure and/or depression when in combination with opioid analgesics. Opioids are relatively contraindicated in some medical conditions such as central sleep apnea.   Fractures and Falls:  Opioids increase the risk and incidence of falls. This is of particular importance in elderly patients.  Endocrine System:  Long-term administration is associated with endocrine abnormalities (endocrinopathies). (Also known as Opioid-induced Endocrinopathy) Influences on both the hypothalamic-pituitary-adrenal axis?and the hypothalamic-pituitary-gonadal axis have been demonstrated with consequent hypogonadism and adrenal insufficiency in both sexes. Hypogonadism and decreased levels of dehydroepiandrosterone sulfate have been reported in men and women. Endocrine effects include: Amenorrhoea in women (abnormal absence of menstruation) Reduced libido in both sexes Decreased sexual function Erectile dysfunction in men Hypogonadisms (decreased testicular function with shrinkage of testicles) Infertility Depression and fatigue Loss of muscle mass Anxiety Depression Immune suppression Hyperalgesia Weight gain Anemia Osteoporosis Patients (particularly women of childbearing age) should avoid opioids. There is insufficient evidence to recommend routine monitoring of asymptomatic patients taking opioids in the long-term for hormonal deficiencies.  Immune System: Human studies have demonstrated that opioids have an immunomodulating effect. These effects are mediated via opioid receptors both on immune effector cells and in the central nervous system. Opioids have been demonstrated to have adverse effects on antimicrobial response and anti-tumour surveillance. Buprenorphine has been demonstrated to have  no impact on immune function.  Opioid Induced Hyperalgesia: Human studies have demonstrated that prolonged use of opioids can lead to a state of abnormal pain sensitivity, sometimes called opioid induced hyperalgesia (OIH). Opioid induced hyperalgesia is not usually seen in the absence of tolerance to opioid analgesia. Clinically, hyperalgesia may be diagnosed if the patient on long-term opioid therapy presents with increased pain. This might be qualitatively and anatomically distinct from pain related to disease progression or to breakthrough pain resulting from development of opioid tolerance. Pain associated with hyperalgesia tends to be more diffuse than the pre-existing pain and less defined in quality. Management of opioid induced hyperalgesia requires opioid dose reduction.  Cancer: Chronic opioid therapy has been associated with an increased risk  of cancer among noncancer patients with chronic pain. This association was more evident in chronic strong opioid users. Chronic opioid consumption causes significant pathological changes in the small intestine and colon. Epidemiological studies have found that there is a link between opium dependence and initiation of gastrointestinal cancers. Cancer is the second leading cause of death after cardiovascular disease. Chronic use of opioids can cause multiple conditions such as GERD, immunosuppression and renal damage as well as carcinogenic effects, which are associated with the incidence of cancers.   Mortality: Long-term opioid use has been associated with increased mortality among patients with chronic non-cancer pain (CNCP).  Prescription of long-acting opioids for chronic noncancer pain was associated with a significantly increased risk of all-cause mortality, including deaths from causes other than overdose.  Reference: Von Korff M, Kolodny A, Deyo RA, Chou R. Long-term opioid therapy reconsidered. Ann Intern Med. 2011 Sep 6;155(5):325-8. doi:  10.7326/[id number]. PMID: 16109604; PMCID: [id number]. Randon Goldsmith, Hayward RA, Dunn KM, [place] KP. Risk of adverse events in patients prescribed long-term opioids: A cohort study in the [place] [facility] [employer]. Eur J Pain. 2019 May;23(5):908-922. doi: 10.1002/ejp.1357. Epub 2019 Jan 31. PMID: 78295621. Colameco S, Coren JS, Ciervo CA. Continuous opioid treatment for chronic noncancer pain: a time for moderation in prescribing. Postgrad Med. 2009 Jul;121(4):61-6. doi: 10.3810/pgm.2009.07.2032. PMID: 30865784. William Hamburger RN, [place], Blazina I, Cristopher [place], Bougatsos C, Deyo RA. The effectiveness and risks of long-term opioid therapy for chronic pain: a systematic review for a [employer] [facility] [employer] [facility] to [employer]. Ann Intern Med. 2015 Feb 17;162(4):276-86. doi: 10.7326/M14-2559. PMID: 69629528. [facility], [place] DM. NCHS Data Brief No. 22. [place]: Centers for Disease Control and Prevention; 2009. Sep, Increase in Fatal Poisonings Involving Opioid Analgesics in the [place], 1999-2006. Song IA, Choi HR, Oh TK. Long-term opioid use and mortality in patients with chronic non-cancer pain: Ten-year follow-up study in [place] from 2010 through 2019. EClinicalMedicine. 2022 Jul 18;51:101558. doi: 10.1016/j.eclinm.[id number]. PMID: 01027253; PMCID: [id number]. Huser, W., Schubert, T., Vogelmann, T. et al. All-cause mortality in patients with long-term opioid therapy compared with non-opioid analgesics for chronic non-cancer pain: a database study. BMC Med 18, 162 (2020). http://lester.info/ Rashidian H, Karie Kirks, Malekzadeh R, Haghdoost AA. An Ecological Study of the Association between Opiate Use and Incidence of Cancers. Addict Health. 2016 Fall;8(4):252-260. PMID: 59563875; PMCID: [id number].  Our Goal: Our goal is to control your pain with means other  than the use of opioid pain medications.  Our Recommendation: Talk to your physician about coming off of these medications. We can assist you with the tapering down and stopping these medicines. Based on the new information, even if you cannot completely stop the medication, a decrease in the dose may be associated with a lesser risk. Ask for other means of controlling the pain. Decrease or eliminate those factors that significantly contribute to your pain such as smoking, obesity, and a diet heavily tilted towards "inflammatory" nutrients.  Last Updated: 04/30/2023   ______________________________________________________________________

## 2023-09-27 NOTE — Progress Notes (Deleted)
Patient: Jamie Walker  Service Category: E/M  Provider: Oswaldo Done, MD  DOB: 1961/02/10  DOS: 10/01/2023  Referring Provider: Jene Every, MD  MRN: [id number]  Setting: Ambulatory outpatient  PCP: Patrice Paradise, MD  Type: New Patient  Specialty: Interventional Pain Management    Location: Office  Delivery: Face-to-face     Primary Reason(s) for Visit: Encounter for initial evaluation of one or more chronic problems (new to examiner) potentially causing chronic pain, and posing a threat to normal musculoskeletal function. (Level of risk: High) CC: No chief complaint on file.  HPI  Jamie Walker is a 62 y.o. year old, female patient, who comes for the first time to our practice referred by Jene Every, MD for our initial evaluation of her chronic pain. She has Chronic low back pain (Bilateral) w/o sciatica; Chronic hip pain (Bilateral); CKD (chronic kidney disease) stage 3, GFR 30-59 ml/min (HCC); Corn; Iliotibial band syndrome; Open wound of foot; Osteoarthritis of hip; Postoperative hypothyroidism; Chronic pain syndrome; Pharmacologic therapy; Disorder of skeletal system; Problems influencing health status; and Chronic use of opiate for therapeutic purpose on their problem list. Today she comes in for evaluation of her No chief complaint on file.  Pain Assessment: Location:     Radiating:   Onset:   Duration:   Quality:   Severity:  /10 (subjective, self-reported pain score)  Effect on ADL:   Timing:   Modifying factors:   BP:    HR:    Onset and Duration: {Hx; Onset and Duration:210120511} Cause of pain: {Hx; Cause:210120521} Severity: {Pain Severity:210120502} Timing: {Symptoms; Timing:210120501} Aggravating Factors: {Causes; Aggravating pain factors:210120507} Alleviating Factors: {Causes; Alleviating Factors:210120500} Associated Problems: {Hx; Associated problems:210120515} Quality of Pain: {Hx; Symptom quality or Descriptor:210120531} Previous Examinations  or Tests: {Hx; Previous examinations or test:210120529} Previous Treatments: {Hx; Previous Treatment:210120503}  Jamie Walker is being evaluated for possible interventional pain management therapies for the treatment of her chronic pain.  Discussed the use of AI scribe software for clinical note transcription with the patient, who gave verbal consent to proceed.  History of Present Illness           *** Jamie Walker has been informed that this initial visit was an evaluation only.  On the follow up appointment I will go over the results, including ordered tests and available interventional therapies. At that time she will have the opportunity to decide whether to proceed with offered therapies or not. In the event that Jamie Walker prefers avoiding interventional options, this will conclude our involvement in the case.  Medication management recommendations may be provided upon request.  Patient informed that diagnostic tests may be ordered to assist in identifying underlying causes, narrow the list of differential diagnoses and aid in determining candidacy for (or contraindications to) planned therapeutic interventions.  Historic Controlled Substance Pharmacotherapy Review  PMP and historical list of controlled substances: Hydromorphone 2 mg tablet, 1 tab p.o. twice daily (# 10) (last filled on 09/23/2023); oxycodone IR 5 mg tablet, 1 tab p.o. twice daily (# 10) (last filled on 09/22/2023); tramadol 50 mg tablet, 1 tab p.o. 4 times daily (# 20) (last filled on 09/19/2023); hydrocodone/APAP 5/325, 1 tab p.o. 5 times a day (#10) (last filled on 09/13/2023); gabapentin 100 mg, 1 tab p.o. 3 times daily (90/month) (last filled on 06/29/2023); gabapentin 300 mg capsule, 1 cap p.o. daily (30/month) (last filled on 06/27/2023) Most recently prescribed opioid analgesics:   Hydromorphone 2 mg tablet, 1 tab p.o. twice daily (# 10) (last filled  on 09/23/2023) MME/day: 20 mg/day  Historical Monitoring: The patient   has no history on file for drug use. List of prior UDS Testing: No results found for: "MDMA", "COCAINSCRNUR", "PCPSCRNUR", "PCPQUANT", "CANNABQUANT", "THCU", "ETH", "CBDTHCR", "D8THCCBX", "D9THCCBX" Historical Background Evaluation: [place] PMP: PDMP reviewed during this encounter. Review of the past 60-months conducted. Abnormal pattern detected. Pattern of multiple prescribers found. PMP NARX Score Report:  Narcotic: 361 Sedative: 190 Stimulant: 000 [place] Department of public safety, offender search: Engineer, mining Information) Non-contributory Risk Assessment Profile: Aberrant behavior: None observed or detected today Risk factors for fatal opioid overdose: None identified today PMP NARX Overdose Risk Score: 260 Fatal overdose hazard ratio (HR): Calculation deferred Non-fatal overdose hazard ratio (HR): Calculation deferred Risk of opioid abuse or dependence: 0.7-3.0% with doses <= 36 MME/day and 6.1-26% with doses >= 120 MME/day. Substance use disorder (SUD) risk level: See below Personal History of Substance Abuse (SUD-Substance use disorder):  Alcohol:    Illegal Drugs:    Rx Drugs:    ORT Risk Level calculation:    ORT Scoring interpretation table:  Score <3 = Low Risk for SUD  Score between 4-7 = Moderate Risk for SUD  Score >8 = High Risk for Opioid Abuse   PHQ-2 Depression Scale:  Total score:    PHQ-2 Scoring interpretation table: (Score and probability of major depressive disorder)  Score 0 = No depression  Score 1 = 15.4% Probability  Score 2 = 21.1% Probability  Score 3 = 38.4% Probability  Score 4 = 45.5% Probability  Score 5 = 56.4% Probability  Score 6 = 78.6% Probability   PHQ-9 Depression Scale:  Total score:    PHQ-9 Scoring interpretation table:  Score 0-4 = No depression  Score 5-9 = Mild depression  Score 10-14 = Moderate depression  Score 15-19 = Moderately severe depression  Score 20-27 = Severe depression (2.4 times higher risk of SUD and 2.89 times higher  risk of overuse)   Pharmacologic Plan: As per protocol, I have not taken over any controlled substance management, pending the results of ordered tests and/or consults.            Initial impression: Pending review of available data and ordered tests.  Meds   Current Outpatient Medications:    cephALEXin (KEFLEX) 500 MG capsule, Take 500 mg by mouth 2 (two) times daily., Disp: , Rfl:    HYDROmorphone (DILAUDID) 2 MG tablet, Take by mouth., Disp: , Rfl:    hydrOXYzine (ATARAX) 25 MG tablet, Take by mouth., Disp: , Rfl:    levothyroxine (SYNTHROID) 75 MCG tablet, Take 1 tablet by mouth daily., Disp: , Rfl:    lidocaine (XYLOCAINE) 2 % solution, Swish and spit 5 mLs 4 (four) times daily as needed for Pain for up to 10 days, Disp: , Rfl:    Naloxone HCl 3 MG/0.1ML LIQD, One spray in either nostril once for known/suspected opioid overdose. May repeat every 2-3 minutes in alternating nostril til EMS arrives, Disp: , Rfl:    NEOMYCIN-POLYMYXIN-HYDROCORTISONE (CORTISPORIN) 1 % SOLN OTIC solution, , Disp: , Rfl:    nortriptyline (PAMELOR) 10 MG capsule, Take 1 tablet by mouth before bedtime for 4 days, then if pain not improved begin taking 2 tablets by mouth before bedtime., Disp: , Rfl:    Cholecalciferol (VITAMIN D-1000 MAX ST) 25 MCG (1000 UT) tablet, Take 1,000 Units by mouth daily., Disp: , Rfl:    EPINEPHrine 0.3 mg/0.3 mL IJ SOAJ injection, Inject 0.3 mLs into the muscle as needed.,  Disp: , Rfl:    HYDROcodone-acetaminophen (NORCO/VICODIN) 5-325 MG tablet, Take 1 tablet by mouth every 6 (six) hours as needed., Disp: , Rfl:    SYNTHROID 75 MCG tablet, , Disp: , Rfl:   Imaging Review   Complexity Note: No results found under the [facility] electronic medical record.                         ROS  Cardiovascular: {Hx; Cardiovascular History:210120525} Pulmonary or Respiratory: {Hx; Pumonary and/or Respiratory History:210120523} Neurological: {Hx;  Neurological:210120504} Psychological-Psychiatric: {Hx; Psychological-Psychiatric History:210120512} Gastrointestinal: {Hx; Gastrointestinal:210120527} Genitourinary: {Hx; Genitourinary:210120506} Hematological: {Hx; Hematological:210120510} Endocrine: {Hx; Endocrine history:210120509} Rheumatologic: {Hx; Rheumatological:210120530} Musculoskeletal: {Hx; Musculoskeletal:210120528} Work History: {Hx; Work history:210120514}  Allergies  Jamie Walker is allergic to wasp venom, wasp venom protein, amoxicillin-pot clavulanate, avocado, morphine, and piper.  Laboratory Chemistry Profile   Renal Lab Results  Component Value Date   BUN 21 (H) 01/09/2019   CREATININE 1.12 (H) 01/09/2019   GFRAA >60 01/09/2019   GFRNONAA 55 (L) 01/09/2019   SPECGRAV 1.010 09/03/2023   PHUR 6.0 09/03/2023   PROTEINUR Negative 09/03/2023     Electrolytes Lab Results  Component Value Date   NA 138 01/09/2019   K 3.9 01/09/2019   CL 102 01/09/2019   CALCIUM 9.1 01/09/2019     Hepatic Lab Results  Component Value Date   AST 19 01/09/2019   ALT 9 01/09/2019   ALBUMIN 4.0 01/09/2019   ALKPHOS 65 01/09/2019     ID No results found for: "LYMEIGGIGMAB", "HIV", "SARSCOV2NAA", "STAPHAUREUS", "MRSAPCR", "HCVAB", "PREGTESTUR", "RMSFIGG", "QFVRPH1IGG", "QFVRPH2IGG"   Bone No results found for: "VD25OH", "VD125OH2TOT", "DG6440HK7", "QQ5956LO7", "25OHVITD1", "25OHVITD2", "25OHVITD3", "TESTOFREE", "TESTOSTERONE"   Endocrine Lab Results  Component Value Date   GLUCOSE 149 (H) 01/09/2019   GLUCOSEU Negative 09/03/2023     Neuropathy No results found for: "VITAMINB12", "FOLATE", "HGBA1C", "HIV"   CNS No results found for: "COLORCSF", "APPEARCSF", "RBCCOUNTCSF", "WBCCSF", "POLYSCSF", "LYMPHSCSF", "EOSCSF", "PROTEINCSF", "GLUCCSF", "JCVIRUS", "CSFOLI", "IGGCSF", "LABACHR", "ACETBL"   Inflammation (CRP: Acute  ESR: Chronic) No results found for: "CRP", "ESRSEDRATE", "LATICACIDVEN"   Rheumatology No  results found for: "RF", "ANA", "LABURIC", "URICUR", "LYMEIGGIGMAB", "LYMEABIGMQN", "HLAB27"   Coagulation Lab Results  Component Value Date   PLT 178 01/09/2019     Cardiovascular Lab Results  Component Value Date   TROPONINI <0.03 01/09/2019   HGB 12.9 01/09/2019   HCT 37.6 01/09/2019     Screening No results found for: "SARSCOV2NAA", "COVIDSOURCE", "STAPHAUREUS", "MRSAPCR", "HCVAB", "HIV", "PREGTESTUR"   Cancer No results found for: "CEA", "CA125", "LABCA2"   Allergens No results found for: "ALMOND", "APPLE", "ASPARAGUS", "AVOCADO", "BANANA", "BARLEY", "BASIL", "BAYLEAF", "GREENBEAN", "LIMABEAN", "WHITEBEAN", "BEEFIGE", "REDBEET", "BLUEBERRY", "BROCCOLI", "CABBAGE", "MELON", "CARROT", "CASEIN", "CASHEWNUT", "CAULIFLOWER", "CELERY"     Note: Lab results reviewed.  PFSH  Drug: Jamie Walker  has no history on file for drug use. Alcohol:  has no history on file for alcohol use. Tobacco:  reports that she has never smoked. She has never used smokeless tobacco. Medical:  has no past medical history on file. Family: family history is not on file.  Past Surgical History:  Procedure Laterality Date   THYROIDECTOMY     TOOTH EXTRACTION     Active Ambulatory Problems    Diagnosis Date Noted   Chronic low back pain (Bilateral) w/o sciatica 12/09/2018   Chronic hip pain (Bilateral) 12/09/2018   CKD (chronic kidney disease) stage 3, GFR 30-59 ml/min (HCC) 12/09/2018   Corn 12/19/2015   Iliotibial  band syndrome 01/07/2019   Open wound of foot 04/27/2015   Osteoarthritis of hip 04/27/2015   Postoperative hypothyroidism 10/24/2018   Chronic pain syndrome 09/27/2023   Pharmacologic therapy 09/27/2023   Disorder of skeletal system 09/27/2023   Problems influencing health status 09/27/2023   Chronic use of opiate for therapeutic purpose 09/27/2023   Resolved Ambulatory Problems    Diagnosis Date Noted   No Resolved Ambulatory Problems   No Additional Past Medical History    Constitutional Exam  General appearance: Well nourished, well developed, and well hydrated. In no apparent acute distress There were no vitals filed for this visit. BMI Assessment: Estimated body mass index is 19.22 kg/m as calculated from the following:   Height as of 09/11/23: 5\' 4"  (1.626 m).   Weight as of 09/11/23: 112 lb (50.8 kg).  BMI interpretation table: BMI level Category Range association with higher incidence of chronic pain  <18 kg/m2 Underweight   18.5-24.9 kg/m2 Ideal body weight   25-29.9 kg/m2 Overweight Increased incidence by 20%  30-34.9 kg/m2 Obese (Class I) Increased incidence by 68%  35-39.9 kg/m2 Severe obesity (Class II) Increased incidence by 136%  >40 kg/m2 Extreme obesity (Class III) Increased incidence by 254%   Patient's current BMI Ideal Body weight  There is no height or weight on file to calculate BMI. Patient weight not recorded   BMI Readings from Last 4 Encounters:  09/11/23 19.22 kg/m  09/03/23 19.05 kg/m  08/25/23 19.90 kg/m  08/08/23 19.91 kg/m   Wt Readings from Last 4 Encounters:  09/11/23 112 lb (50.8 kg)  09/03/23 111 lb (50.3 kg)  08/25/23 115 lb 15.4 oz (52.6 kg)  08/08/23 116 lb (52.6 kg)    Psych/Mental status: Alert, oriented x 3 (person, place, & time)       Eyes: PERLA Respiratory: No evidence of acute respiratory distress  Assessment  Primary Diagnosis & Pertinent Problem List: The primary encounter diagnosis was Chronic pain syndrome. Diagnoses of Pharmacologic therapy, Disorder of skeletal system, Problems influencing health status, and Chronic use of opiate for therapeutic purpose were also pertinent to this visit.  Visit Diagnosis (New problems to examiner): 1. Chronic pain syndrome   2. Pharmacologic therapy   3. Disorder of skeletal system   4. Problems influencing health status   5. Chronic use of opiate for therapeutic purpose    Plan of Care (Initial workup plan)  Note: Jamie Walker was reminded that  as per protocol, today's visit has been an evaluation only. We have not taken over the patient's controlled substance management.  Problem-specific plan: Assessment and Plan            Lab Orders  No laboratory test(s) ordered today   Imaging Orders  No imaging studies ordered today   Referral Orders  No referral(s) requested today   Procedure Orders    No procedure(s) ordered today   Pharmacotherapy (current): Medications ordered:  No orders of the defined types were placed in this encounter.  Medications administered during this visit: Zayra Walker had no medications administered during this visit.   Analgesic Pharmacotherapy:  Opioid Analgesics: For patients currently taking or requesting to take opioid analgesics, in accordance with [facility] Guidelines, we will assess their risks and indications for the use of these substances. After completing our evaluation, we may offer recommendations, but we no longer take patients for medication management. The prescribing physician will ultimately decide, based on his/her training and level of comfort whether to adopt any  of the recommendations, including whether or not to prescribe such medicines.  Membrane stabilizer: To be determined at a later time  Muscle relaxant: To be determined at a later time  NSAID: To be determined at a later time  Other analgesic(s): To be determined at a later time   Interventional management options: Jamie Walker was informed that there is no guarantee that she would be a candidate for interventional therapies. The decision will be based on the results of diagnostic studies, as well as Jamie Walker's risk profile.  Procedure(s) under consideration:  Pending results of ordered studies      Interventional Therapies  Risk Factors  Considerations  Medical Comorbidities:     Planned  Pending:      Under consideration:   Pending   Completed:   None at this time    Therapeutic  Palliative (PRN) options:   None established   Completed by other providers:   None reported       Provider-requested follow-up: No follow-ups on file.  Future Appointments  Date Time Provider Department Center  10/01/2023  8:00 AM Delano Metz, MD [facility] None    Duration of encounter: *** minutes.  Total time on encounter, as per AMA guidelines included both the face-to-face and non-face-to-face time personally spent by the physician and/or other qualified health care professional(s) on the day of the encounter (includes time in activities that require the physician or other qualified health care professional and does not include time in activities normally performed by clinical staff). Physician's time may include the following activities when performed: Preparing to see the patient (e.g., pre-charting review of records, searching for previously ordered imaging, lab work, and nerve conduction tests) Review of prior analgesic pharmacotherapies. Reviewing PMP Interpreting ordered tests (e.g., lab work, imaging, nerve conduction tests) Performing post-procedure evaluations, including interpretation of diagnostic procedures Obtaining and/or reviewing separately obtained history Performing a medically appropriate examination and/or evaluation Counseling and educating the patient/family/caregiver Ordering medications, tests, or procedures Referring and communicating with other health care professionals (when not separately reported) Documenting clinical information in the electronic or other health record Independently interpreting results (not separately reported) and communicating results to the patient/ family/caregiver Care coordination (not separately reported)  Note by: Oswaldo Done, MD (AI and TTS technology used. I apologize for any typographical errors that were not detected and corrected.) Date: 10/01/2023; Time: 12:19 PM

## 2023-10-01 ENCOUNTER — Inpatient Hospital Stay
Admission: RE | Admit: 2023-10-01 | Discharge: 2023-10-01 | Disposition: A | Discharge status: Home / Self Care | Payer: Self-pay | Source: Ambulatory Visit | Attending: Neurosurgery | Admitting: Neurosurgery

## 2023-10-01 ENCOUNTER — Encounter: Payer: Self-pay | Admitting: Pain Medicine

## 2023-10-01 ENCOUNTER — Other Ambulatory Visit: Payer: Self-pay | Admitting: Family Medicine

## 2023-10-01 ENCOUNTER — Ambulatory Visit: Payer: Medicare Other | Attending: Pain Medicine | Admitting: Pain Medicine

## 2023-10-01 ENCOUNTER — Telehealth: Payer: Self-pay | Admitting: Neurosurgery

## 2023-10-01 VITALS — BP 159/90 | HR 113 | Temp 97.9°F | Ht 64.0 in | Wt 110.0 lb

## 2023-10-01 DIAGNOSIS — Z049 Encounter for examination and observation for unspecified reason: Secondary | ICD-10-CM

## 2023-10-01 DIAGNOSIS — Z79899 Other long term (current) drug therapy: Secondary | ICD-10-CM | POA: Diagnosis present

## 2023-10-01 DIAGNOSIS — G894 Chronic pain syndrome: Secondary | ICD-10-CM | POA: Insufficient documentation

## 2023-10-01 DIAGNOSIS — Z789 Other specified health status: Secondary | ICD-10-CM | POA: Insufficient documentation

## 2023-10-01 DIAGNOSIS — M899 Disorder of bone, unspecified: Secondary | ICD-10-CM | POA: Diagnosis present

## 2023-10-01 DIAGNOSIS — Z79891 Long term (current) use of opiate analgesic: Secondary | ICD-10-CM

## 2023-10-01 NOTE — Telephone Encounter (Signed)
Images are in Epic.

## 2023-10-01 NOTE — Telephone Encounter (Signed)
Patient calling back. She forgot to mention that she saw Dr.Naveira today. Can you review that note also. She says that he was referring her back to neurosurgery about a facial nerve.

## 2023-10-01 NOTE — Progress Notes (Signed)
Safety precautions to be maintained throughout the outpatient stay will include: orient to surroundings, keep bed in low position, maintain call bell within reach at all times, provide assistance with transfer out of bed and ambulation.  

## 2023-10-01 NOTE — Telephone Encounter (Signed)
I sent a request for images to [facility].

## 2023-10-01 NOTE — Telephone Encounter (Signed)
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 There is no evidence of temporal bone disease.   Visualized paranasal sinuses are clear. The nasal cavity is symmetric and free of masses.   The nasopharynx is symmetric and free of masses.   Postsurgical changes of the right maxilla and right mandible with multiple teeth absent and a history of extractions. No adjacent maxillary or mandibular fluid collection. There is patchy T2 isointensity and T1 hyperintensity of the mandible and the maxilla, but no abnormal postcontrast enhancement. The salivary glands are normal.   The cranial nerves are visualized and intact. Specifically. No mass, abnormal enhancement, or evidence of compression of the visualized trigeminal nerves.   The valleculae and piriform sinuses are well aerated. The supraglottic, glottic, and infraglottic larynx is free of mass. No evidence of vocal cord paralysis. There is no abnormal enhancement.   No lymphadenopathy.   The thyroid gland is symmetric and free of masses.    *She had this MRI at [facility] and she would like to follow-up with Dr.Yarbrough. Are you ok with 15 or 30 mins.?

## 2023-10-01 NOTE — Progress Notes (Signed)
Patient: Jamie Walker  Service Category: E/M  Provider: Oswaldo Done, MD  DOB: Mar 09, 1961  DOS: 10/01/2023  Referring Provider: Jene Every, MD  MRN: [id number]  Setting: Ambulatory outpatient  PCP: Patrice Paradise, MD  Type: New Patient  Specialty: Interventional Pain Management    Location: Office  Delivery: Face-to-face     Primary Reason(s) for Visit: Encounter for initial evaluation of one or more chronic problems (new to examiner) potentially causing chronic pain, and posing a threat to normal musculoskeletal function. (Level of risk: High) CC: Other (Mouth pain on the right)  HPI  Jamie Walker is a 62 y.o. year old, female patient, who comes for the first time to our practice referred by Jene Every, MD for our initial evaluation of her chronic pain. She has Chronic low back pain (Bilateral) w/o sciatica; Chronic hip pain (Bilateral); CKD (chronic kidney disease) stage 3, GFR 30-59 ml/min (HCC); Corn; Iliotibial band syndrome; Open wound of foot; Osteoarthritis of hip; Postoperative hypothyroidism; Chronic pain syndrome; Pharmacologic therapy; Disorder of skeletal system; Problems influencing health status; and Chronic use of opiate for therapeutic purpose on their problem list. Today she comes in for evaluation of her Other (Mouth pain on the right)  Pain Assessment: Location: Right Teeth Radiating: Denies Onset: More than a month ago Duration: Chronic pain Quality: Aching, Throbbing, Constant Severity: 8 /10 (subjective, self-reported pain score)  Effect on ADL: limits my daily activities, loss wt Timing: Constant Modifying factors: meds BP: (!) 159/90  HR: (!) 113  Onset and Duration: Gradual and Present longer than 3 months Cause of pain: Unknown Severity: Getting worse, NAS-11 at its worse: 10/10, NAS-11 at its best: 5/10, and NAS-11 on the average: 8/10 Timing: Not influenced by the time of the day Aggravating Factors:  No aggravating factors Alleviating  Factors: Medications Associated Problems: Sweating, Temperature changes, and Pain that does not allow patient to sleep Quality of Pain: Aching, Agonizing, Constant, Deep, Disabling, and Pressure-like Previous Examinations or Tests: CT scan, MRI scan, Nerve block, X-rays, Neurological evaluation, and Neurosurgical evaluation Previous Treatments: Narcotic medications  Jamie Walker is being evaluated for possible interventional pain management therapies for the treatment of her chronic pain.  Discussed the use of AI scribe software for clinical note transcription with the patient, who gave verbal consent to proceed.  History of Present Illness   The patient, with a recent history of dental surgeries, presents with persistent right-sided facial pain. The pain, described as deep and severe, is localized to the area of a tooth extraction performed approximately three months ago. The patient reports the pain has been unrelenting since the extraction, with the sensation of sharp bone fragments in the gums. Despite subsequent surgical interventions, including removal of bone fragments and a bone graft, the pain persists. The patient also reports a sour, bitter taste emanating from the surgical site.  The patient has sought multiple consultations, including a neurosurgeon and an oral facial pain specialist, but the cause of the pain remains undetermined. An MRI scan was performed, revealing irritation in the gums. The patient has trialed various pain medications, including opioids, but reports poor tolerance due to side effects such as anxiety, sweating, and general discomfort. The only opioid tolerated was oxycodone, but the patient expresses reluctance to continue this medication long-term.  Recently, the patient was prescribed gabapentin by the oral facial pain specialist, suspecting peripheral nerve involvement. However, the patient reports poor tolerance to this medication as well, experiencing nausea,  diarrhea, and headaches. The patient also  mentions that the pain intensifies when lying down, making it difficult to sleep. Despite these challenges, the patient continues to seek relief and understanding of the persistent facial pain.     Jamie Walker has been informed that this initial visit was an evaluation only.  On the follow up appointment I will go over the results, including ordered tests and available interventional therapies. At that time she will have the opportunity to decide whether to proceed with offered therapies or not. In the event that Jamie Walker prefers avoiding interventional options, this will conclude our involvement in the case.  Medication management recommendations may be provided upon request.  Patient informed that diagnostic tests may be ordered to assist in identifying underlying causes, narrow the list of differential diagnoses and aid in determining candidacy for (or contraindications to) planned therapeutic interventions.  Historic Controlled Substance Pharmacotherapy Review  PMP and historical list of controlled substances: Gabapentin 250 mg per 5 mL solution (# 120) (last filled on 09/28/2023); hydromorphone 2 mg tablet (# 10) (last filled on 09/23/2023); oxycodone IR 5 mg tablet (# 10) (last filled on 09/22/2023); tramadol 50 mg tablet (# 20) (last filled on 09/19/2023); hydrocodone/APAP 5/325 (# 10) (last filled on 09/13/2023); gabapentin 100 mg capsule (90/month) (# 90) (last filled on 06/29/2023); gabapentin 300 mg capsule, 1 daily (30/month) (last filled on 06/27/2023) Most recently prescribed opioid analgesics:   hydromorphone 2 mg tablet (# 10) (last filled on 09/23/2023) MME/day: 20 mg/day  Historical Monitoring: The patient  has no history on file for drug use. List of prior UDS Testing: No results found for: "MDMA", "COCAINSCRNUR", "PCPSCRNUR", "PCPQUANT", "CANNABQUANT", "THCU", "ETH", "CBDTHCR", "D8THCCBX", "D9THCCBX" Historical Background Evaluation: [place]  PMP: PDMP reviewed during this encounter. Review of the past 65-months conducted.             PMP NARX Score Report:  Narcotic: 360 Sedative: 190 Stimulant: 000 [place] Department of public safety, offender search: Engineer, mining Information) Non-contributory Risk Assessment Profile: Aberrant behavior: None observed or detected today Risk factors for fatal opioid overdose: None identified today PMP NARX Overdose Risk Score: 260 Fatal overdose hazard ratio (HR): Calculation deferred Non-fatal overdose hazard ratio (HR): Calculation deferred Risk of opioid abuse or dependence: 0.7-3.0% with doses <= 36 MME/day and 6.1-26% with doses >= 120 MME/day. Substance use disorder (SUD) risk level: See below Personal History of Substance Abuse (SUD-Substance use disorder):  Alcohol: Negative  Illegal Drugs: Negative  Rx Drugs: Negative  ORT Risk Level calculation: Low Risk  Opioid Risk Tool - 10/01/23 0829       Family History of Substance Abuse   Alcohol Negative    Illegal Drugs Negative    Rx Drugs Negative      Personal History of Substance Abuse   Alcohol Negative    Illegal Drugs Negative    Rx Drugs Negative      Age   Age between 16-45 years  No      History of Preadolescent Sexual Abuse   History of Preadolescent Sexual Abuse Negative or Female      Psychological Disease   Psychological Disease Positive    ADD Negative    OCD Negative    Bipolar Negative    Schizophrenia Negative    Depression Negative      Total Score   Opioid Risk Tool Scoring 2    Opioid Risk Interpretation Low Risk            ORT Scoring interpretation table:  Score <3 = Low Risk  for SUD  Score between 4-7 = Moderate Risk for SUD  Score >8 = High Risk for Opioid Abuse   Pharmacologic Plan: As per protocol, I have not taken over any controlled substance management, pending the results of ordered tests and/or consults.            Initial impression: Pending review of available data and ordered  tests.  Meds   Current Outpatient Medications:    gabapentin (NEURONTIN) 250 MG/5ML solution, Take 16 mg by mouth at bedtime., Disp: , Rfl:    Cholecalciferol (VITAMIN D-1000 MAX ST) 25 MCG (1000 UT) tablet, Take 1,000 Units by mouth daily., Disp: , Rfl:    EPINEPHrine 0.3 mg/0.3 mL IJ SOAJ injection, Inject 0.3 mLs into the muscle as needed., Disp: , Rfl:    nortriptyline (PAMELOR) 10 MG capsule, Take 1 tablet by mouth before bedtime for 4 days, then if pain not improved begin taking 2 tablets by mouth before bedtime. (Patient not taking: Reported on 10/01/2023), Disp: , Rfl:    SYNTHROID 75 MCG tablet, , Disp: , Rfl:   Imaging Review   Complexity Note: No results found under the [facility] electronic medical record.                         ROS  Cardiovascular: No reported cardiovascular signs or symptoms such as High blood pressure, coronary artery disease, abnormal heart rate or rhythm, heart attack, blood thinner therapy or heart weakness and/or failure Pulmonary or Respiratory: No reported pulmonary signs or symptoms such as wheezing and difficulty taking a deep full breath (Asthma), difficulty blowing air out (Emphysema), coughing up mucus (Bronchitis), persistent dry cough, or temporary stoppage of breathing during sleep Neurological: No reported neurological signs or symptoms such as seizures, abnormal skin sensations, urinary and/or fecal incontinence, being born with an abnormal open spine and/or a tethered spinal cord Psychological-Psychiatric: No reported psychological or psychiatric signs or symptoms such as difficulty sleeping, anxiety, depression, delusions or hallucinations (schizophrenial), mood swings (bipolar disorders) or suicidal ideations or attempts Gastrointestinal: No reported gastrointestinal signs or symptoms such as vomiting or evacuating blood, reflux, heartburn, alternating episodes of diarrhea and constipation, inflamed or scarred liver, or pancreas or  irrregular and/or infrequent bowel movements Genitourinary: Kidney disease Hematological: No reported hematological signs or symptoms such as prolonged bleeding, low or poor functioning platelets, bruising or bleeding easily, hereditary bleeding problems, low energy levels due to low hemoglobin or being anemic Endocrine: Slow thyroid Rheumatologic: No reported rheumatological signs and symptoms such as fatigue, joint pain, tenderness, swelling, redness, heat, stiffness, decreased range of motion, with or without associated rash Musculoskeletal: Negative for myasthenia gravis, muscular dystrophy, multiple sclerosis or malignant hyperthermia Work History: Retired  Allergies  Jamie Walker is allergic to wasp venom, wasp venom protein, amoxicillin-pot clavulanate, avocado, morphine, and piper.  Laboratory Chemistry Profile   Renal Lab Results  Component Value Date   BUN 21 (H) 01/09/2019   CREATININE 1.12 (H) 01/09/2019   GFRAA >60 01/09/2019   GFRNONAA 55 (L) 01/09/2019   SPECGRAV 1.010 09/03/2023   PHUR 6.0 09/03/2023   PROTEINUR Negative 09/03/2023     Electrolytes Lab Results  Component Value Date   NA 138 01/09/2019   K 3.9 01/09/2019   CL 102 01/09/2019   CALCIUM 9.1 01/09/2019     Hepatic Lab Results  Component Value Date   AST 19 01/09/2019   ALT 9 01/09/2019   ALBUMIN 4.0 01/09/2019   ALKPHOS 65  01/09/2019     ID No results found for: "LYMEIGGIGMAB", "HIV", "SARSCOV2NAA", "STAPHAUREUS", "MRSAPCR", "HCVAB", "PREGTESTUR", "RMSFIGG", "QFVRPH1IGG", "QFVRPH2IGG"   Bone No results found for: "VD25OH", "VD125OH2TOT", "FA2130QM5", "HQ4696EX5", "25OHVITD1", "25OHVITD2", "25OHVITD3", "TESTOFREE", "TESTOSTERONE"   Endocrine Lab Results  Component Value Date   GLUCOSE 149 (H) 01/09/2019   GLUCOSEU Negative 09/03/2023     Neuropathy No results found for: "VITAMINB12", "FOLATE", "HGBA1C", "HIV"   CNS No results found for: "COLORCSF", "APPEARCSF", "RBCCOUNTCSF", "WBCCSF",  "POLYSCSF", "LYMPHSCSF", "EOSCSF", "PROTEINCSF", "GLUCCSF", "JCVIRUS", "CSFOLI", "IGGCSF", "LABACHR", "ACETBL"   Inflammation (CRP: Acute  ESR: Chronic) No results found for: "CRP", "ESRSEDRATE", "LATICACIDVEN"   Rheumatology No results found for: "RF", "ANA", "LABURIC", "URICUR", "LYMEIGGIGMAB", "LYMEABIGMQN", "HLAB27"   Coagulation Lab Results  Component Value Date   PLT 178 01/09/2019     Cardiovascular Lab Results  Component Value Date   TROPONINI <0.03 01/09/2019   HGB 12.9 01/09/2019   HCT 37.6 01/09/2019     Screening No results found for: "SARSCOV2NAA", "COVIDSOURCE", "STAPHAUREUS", "MRSAPCR", "HCVAB", "HIV", "PREGTESTUR"   Cancer No results found for: "CEA", "CA125", "LABCA2"   Allergens No results found for: "ALMOND", "APPLE", "ASPARAGUS", "AVOCADO", "BANANA", "BARLEY", "BASIL", "BAYLEAF", "GREENBEAN", "LIMABEAN", "WHITEBEAN", "BEEFIGE", "REDBEET", "BLUEBERRY", "BROCCOLI", "CABBAGE", "MELON", "CARROT", "CASEIN", "CASHEWNUT", "CAULIFLOWER", "CELERY"     Note: Lab results reviewed.  PFSH  Drug: Jamie Walker  has no history on file for drug use. Alcohol:  has no history on file for alcohol use. Tobacco:  reports that she has never smoked. She has never used smokeless tobacco. Medical:  has a past medical history of Allergy, Arthritis, and Thyroid disease. Family: family history is not on file.  Past Surgical History:  Procedure Laterality Date   THYROIDECTOMY     TOOTH EXTRACTION     Active Ambulatory Problems    Diagnosis Date Noted   Chronic low back pain (Bilateral) w/o sciatica 12/09/2018   Chronic hip pain (Bilateral) 12/09/2018   CKD (chronic kidney disease) stage 3, GFR 30-59 ml/min (HCC) 12/09/2018   Corn 12/19/2015   Iliotibial band syndrome 01/07/2019   Open wound of foot 04/27/2015   Osteoarthritis of hip 04/27/2015   Postoperative hypothyroidism 10/24/2018   Chronic pain syndrome 09/27/2023   Pharmacologic therapy 09/27/2023   Disorder of  skeletal system 09/27/2023   Problems influencing health status 09/27/2023   Chronic use of opiate for therapeutic purpose 09/27/2023   Resolved Ambulatory Problems    Diagnosis Date Noted   No Resolved Ambulatory Problems   Past Medical History:  Diagnosis Date   Allergy    Arthritis    Thyroid disease    Constitutional Exam  General appearance: Well nourished, well developed, and well hydrated. In no apparent acute distress Vitals:   10/01/23 0813  BP: (!) 159/90  Pulse: (!) 113  Temp: 97.9 F (36.6 C)  SpO2: 100%  Weight: 110 lb (49.9 kg)  Height: 5\' 4"  (1.626 m)   BMI Assessment: Estimated body mass index is 18.88 kg/m as calculated from the following:   Height as of this encounter: 5\' 4"  (1.626 m).   Weight as of this encounter: 110 lb (49.9 kg).  BMI interpretation table: BMI level Category Range association with higher incidence of chronic pain  <18 kg/m2 Underweight   18.5-24.9 kg/m2 Ideal body weight   25-29.9 kg/m2 Overweight Increased incidence by 20%  30-34.9 kg/m2 Obese (Class I) Increased incidence by 68%  35-39.9 kg/m2 Severe obesity (Class II) Increased incidence by 136%  >40 kg/m2 Extreme obesity (Class III) Increased incidence  by 254%   Patient's current BMI Ideal Body weight  Body mass index is 18.88 kg/m. Ideal body weight: 54.7 kg (120 lb 9.5 oz)   BMI Readings from Last 4 Encounters:  10/01/23 18.88 kg/m  09/11/23 19.22 kg/m  09/03/23 19.05 kg/m  08/25/23 19.90 kg/m   Wt Readings from Last 4 Encounters:  10/01/23 110 lb (49.9 kg)  09/11/23 112 lb (50.8 kg)  09/03/23 111 lb (50.3 kg)  08/25/23 115 lb 15.4 oz (52.6 kg)   Psych/Mental status: Alert, oriented x 3 (person, place, & time)       Eyes: PERLA Respiratory: No evidence of acute respiratory distress  Assessment  Primary Diagnosis & Pertinent Problem List: The primary encounter diagnosis was Chronic pain syndrome. Diagnoses of Pharmacologic therapy, Disorder of skeletal  system, and Problems influencing health status were also pertinent to this visit.  Visit Diagnosis (New problems to examiner): 1. Chronic pain syndrome   2. Pharmacologic therapy   3. Disorder of skeletal system   4. Problems influencing health status    Plan of Care (Initial workup plan)  Note: Jamie Walker was reminded that as per protocol, today's visit has been an evaluation only. We have not taken over the patient's controlled substance management.  Problem-specific plan: Assessment and Plan    Right-sided Facial Pain They have been experiencing persistent severe right-sided facial pain since October 9th, following tooth extraction and subsequent surgeries. The pain, described as deep and unbearable, is localized to the upper jaw and has worsened with recent surgical interventions. Despite the use of opioids and a nerve block, the pain persists, suggesting a central origin. Although the differential diagnosis included trigeminal neuralgia, an MRI and neurosurgeon evaluation did not support this. A trial of gabapentin resulted in significant side effects, including nausea, diarrhea, and headache. After discussing the risks and benefits, we will consider pregabalin (Lyrica) as an alternative, pending insurance approval due to documented intolerance to gabapentin. We will refer them to a neurosurgeon for further evaluation of MRI findings and the potential central origin of pain. A follow-up with an oral surgeon or primary care physician is recommended for a persistent sour taste and potential infection, along with a CBC with differential to rule out infection. Information on opioid use and risks, including the necessity of a Narcan prescription for overdose prevention, was provided.  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 After providing informed consent regarding the lesion's potential seriousness, we will refer them to a neurosurgeon for further evaluation.  Medication Intolerance They have shown poor tolerance to multiple pain medications, including opioids and gabapentin, with gabapentin causing significant side effects even at low doses. Oxycodone is tolerated but they prefer to avoid long-term opioid use. We discussed a potential trial of pregabalin (Lyrica), which has fewer side effects. They are advised to report any side effects to the prescribing physician for medication adjustment and to consult with a pharmacist for a drug interaction review.  Follow-up They are advised to follow up with a neurosurgeon for MRI evaluation and potential neoplasm, with an oral surgeon or primary care physician for persistent sour taste and potential infection, and to consider a CBC with differential to rule out infection.       Lab Orders  No laboratory test(s) ordered today    Imaging Orders  No imaging studies ordered today  Referral Orders  No referral(s) requested today   Procedure Orders    No procedure(s) ordered today   Pharmacotherapy (current): Medications ordered:  No orders of the defined types were placed in this encounter.  Medications administered during this visit: Jamie Walker had no medications administered during this visit.   Analgesic Pharmacotherapy:  Opioid Analgesics: For patients currently taking or requesting to take opioid analgesics, in accordance with [facility] Guidelines, we will assess their risks and indications for the use of these substances. After completing our evaluation, we may offer recommendations, but we no longer take patients for medication management. The prescribing physician will ultimately decide, based on his/her training and level of comfort whether to adopt any of the recommendations, including whether or not to prescribe such  medicines.  Membrane stabilizer: To be determined at a later time  Muscle relaxant: To be determined at a later time  NSAID: To be determined at a later time  Other analgesic(s): To be determined at a later time   Interventional management options: Jamie Walker was informed that there is no guarantee that she would be a candidate for interventional therapies. The decision will be based on the results of diagnostic studies, as well as Jamie Walker's risk profile.  Procedure(s) under consideration:  Pending results of ordered studies      Interventional Therapies  Risk Factors  Considerations  Medical Comorbidities:     Planned  Pending:      Under consideration:   Pending   Completed:   None at this time   Therapeutic  Palliative (PRN) options:   None established   Completed by other providers:   None reported      Provider-requested follow-up: No follow-ups on file.  No future appointments.  Duration of encounter: 46 minutes.  Total time on encounter, as per AMA guidelines included both the face-to-face and non-face-to-face time personally spent by the physician and/or other qualified health care professional(s) on the day of the encounter (includes time in activities that require the physician or other qualified health care professional and does not include time in activities normally performed by clinical staff). Physician's time may include the following activities when performed: Preparing to see the patient (e.g., pre-charting review of records, searching for previously ordered imaging, lab work, and nerve conduction tests) Review of prior analgesic pharmacotherapies. Reviewing PMP Interpreting ordered tests (e.g., lab work, imaging, nerve conduction tests) Performing post-procedure evaluations, including interpretation of diagnostic procedures Obtaining and/or reviewing separately obtained history Performing a medically appropriate examination and/or  evaluation Counseling and educating the patient/family/caregiver Ordering medications, tests, or procedures Referring and communicating with other health care professionals (when not separately reported) Documenting clinical information in the electronic or other health record Independently interpreting results (not separately reported) and communicating results to the patient/ family/caregiver Care coordination (not separately reported)  Note by: Oswaldo Done, MD (AI and TTS technology used. I apologize for any typographical errors that were not detected and corrected.) Date: 10/01/2023; Time: 11:53 AM

## 2023-10-03 NOTE — Telephone Encounter (Signed)
She confirmed appt for 10/23/2023.

## 2023-10-23 ENCOUNTER — Encounter: Payer: Self-pay | Admitting: Neurosurgery

## 2023-10-23 ENCOUNTER — Ambulatory Visit (INDEPENDENT_AMBULATORY_CARE_PROVIDER_SITE_OTHER): Payer: Medicare Other | Admitting: Neurosurgery

## 2023-10-23 VITALS — BP 142/72 | Ht 64.0 in | Wt 110.0 lb

## 2023-10-23 DIAGNOSIS — G501 Atypical facial pain: Secondary | ICD-10-CM

## 2023-10-23 DIAGNOSIS — D329 Benign neoplasm of meninges, unspecified: Secondary | ICD-10-CM

## 2023-10-23 DIAGNOSIS — G500 Trigeminal neuralgia: Secondary | ICD-10-CM | POA: Diagnosis not present

## 2023-10-23 NOTE — Progress Notes (Signed)
 Referring Physician:  No referring provider defined for this encounter.  Primary Physician:  Jamie Eva POUR, MD  History of Present Illness: 10/23/2023 She is slightly better after having 2 additional procedures with an oral surgeon.  However, she continues to have pain around her right upper jaw.  She feels like it is swollen all the time.  09/11/2023 [name] is here today with a chief complaint of right-sided facial pain.  She had an initial episode of pain into her right lower jaw in late 2023.  She was diagnosed with trigeminal neuralgia and started on a steroid taper.  Her pain then resolved.  Over the past several months, she has also had pain into her right upper jaw.  She had a dental procedure performed approxi-1 month ago and has been in incapacitating pain since that time.  She has another procedure planned for tomorrow.  Nothing really helps or makes it worse.  It has been constant and is life altering.  Her pain is 10 out of 10.  She does not have any numbness.  She sometimes has radiation of her pain across the midline to the left side of her face near the corner of her mouth.  However, most of her pain is localized to the site where she had her prior dental procedure performed.  She previously tried carbamazepine for 2 days but was unable to tolerate the medication.  It did not help her pain.  Bowel/Bladder Dysfunction: none  The symptoms are causing a significant impact on the patient's life.   I have utilized the care everywhere function in epic to review the outside records available from external health systems.  Review of Systems:  A 10 point review of systems is negative, except for the pertinent positives and negatives detailed in the HPI.  Past Medical History: Past Medical History:  Diagnosis Date   Allergy    Arthritis    Thyroid disease     Past Surgical History: Past Surgical History:  Procedure Laterality Date   THYROIDECTOMY      TOOTH EXTRACTION      Allergies: Allergies as of 10/23/2023 - Review Complete 10/23/2023  Allergen Reaction Noted   Wasp venom Anaphylaxis 10/24/2018   Wasp venom protein Other (See Comments) 09/03/2023   Amoxicillin-pot clavulanate Other (See Comments) 08/16/2023   Avocado Other (See Comments) 10/24/2018   Morphine  Nausea And Vomiting 09/11/2023   Piper Other (See Comments) 10/24/2018    Medications:  Current Outpatient Medications:    Cholecalciferol (VITAMIN D-1000 MAX ST) 25 MCG (1000 UT) tablet, Take 1,000 Units by mouth daily., Disp: , Rfl:    EPINEPHrine 0.3 mg/0.3 mL IJ SOAJ injection, Inject 0.3 mLs into the muscle as needed., Disp: , Rfl:    SYNTHROID 75 MCG tablet, , Disp: , Rfl:   Social History: Social History   Tobacco Use   Smoking status: Never   Smokeless tobacco: Never    Family Medical History: History reviewed. No pertinent family history.  Physical Examination: Vitals:   10/23/23 1320  BP: (!) 142/72    General: Patient is in obvious pain. Attention to examination is appropriate.  Neck:   Supple.  Full range of motion.  Respiratory: Patient is breathing without any difficulty.   NEUROLOGICAL:     Awake, alert, oriented to person, place, and time.  Speech is clear and fluent.   Cranial Nerves: Pupils equal round and reactive to light.  Facial tone is symmetric.  Facial sensation is symmetric.  Shoulder shrug is symmetric. Tongue protrusion is midline.  There is no pronator drift.  Strength: Side Biceps Triceps Deltoid Interossei Grip Wrist Ext. Wrist Flex.  R 5 5 5 5 5 5 5   L 5 5 5 5 5 5 5    Side Iliopsoas Quads Hamstring PF DF EHL  R 5 5 5 5 5 5   L 5 5 5 5 5 5    Reflexes are 1+ and symmetric at the biceps, triceps, brachioradialis, patella and achilles.   Hoffman's is absent.   Bilateral upper and lower extremity sensation is intact to light touch.    No evidence of dysmetria noted.  Gait is normal.     Medical Decision  Making  Imaging: MRI Brain 06/05/2023 IMPRESSION: 1. The patient does have a vessel adjacent to the root entry zone of the right fifth nerve. This could possibly relate to the patient's symptoms. No other regional finding to explain the presenting symptoms. 2. No visible active dental disease or bone lesion of the maxilla or mandible. Several right mandibular tooth extractions in the past. Question old healed right mandibular fracture.     Electronically Signed   By: Jamie Walker M.D.   On: 06/05/2023 15:28  I have personally reviewed the images and agree with the above interpretation.  Assessment and Plan: Jamie Walker is a pleasant 62 y.o. female with atypical facial pain.  She may have had a bout of trigeminal neuralgia last year that has resolved.  However, her pain now is localized to the area of her prior procedure.  She has only improved a small amount.  I would like to discuss with my former colleague Dr. Susa to see if she may benefit from balloon rhizotomy or some other procedure.  At this point, I think it would be difficult to support a microvascular decompression.  I would also like to discuss her case with a dentist.  I will do my best to reach out to 1 for that.  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  Will repeat her MRI scan in 3 months.  We did review that narcotics typically do not help with nerve related pain in this area.  However pain relief is usually focused on neuropathic pain medications such as gabapentin, carbamazepine, and other related medications.  I spent a total of 30 minutes in this patient's care today. This time was spent reviewing pertinent records including imaging studies, obtaining and confirming history, performing a directed evaluation, formulating and discussing my recommendations, and documenting the visit within the medical record.     Thank you for involving me in the care of this patient.      Jamie Mahr K. Clois MD,  [facility]

## 2023-11-01 ENCOUNTER — Other Ambulatory Visit: Payer: Self-pay | Admitting: Neurosurgery

## 2023-11-01 DIAGNOSIS — G501 Atypical facial pain: Secondary | ICD-10-CM

## 2023-11-01 NOTE — Progress Notes (Signed)
 Placing referral to [facility] maxillofacial specialists per recommendation of Armandina Stammer from [facility] neurosurgery.

## 2023-12-09 ENCOUNTER — Other Ambulatory Visit: Payer: Self-pay

## 2023-12-09 DIAGNOSIS — R300 Dysuria: Secondary | ICD-10-CM | POA: Diagnosis present

## 2023-12-09 DIAGNOSIS — N390 Urinary tract infection, site not specified: Secondary | ICD-10-CM | POA: Diagnosis not present

## 2023-12-09 LAB — URINALYSIS, ROUTINE W REFLEX MICROSCOPIC
Bilirubin Urine: NEGATIVE
Glucose, UA: NEGATIVE mg/dL
Ketones, ur: NEGATIVE mg/dL
Nitrite: NEGATIVE
Protein, ur: NEGATIVE mg/dL
Specific Gravity, Urine: 1.001 — ABNORMAL LOW (ref 1.005–1.030)
Squamous Epithelial / HPF: 0 /[HPF] (ref 0–5)
WBC, UA: >50 WBC/hpf (ref 0–5)
pH: 7 (ref 5.0–8.0)

## 2023-12-09 NOTE — ED Triage Notes (Signed)
 Pt arrives via POV with CC of burning with urination and change in frequency. Denies any other S&S. Started today at 2pm and has taken Azo x2 without relief.

## 2023-12-10 ENCOUNTER — Emergency Department
Admission: EM | Admit: 2023-12-10 | Discharge: 2023-12-10 | Disposition: A | Discharge status: Home / Self Care | Payer: Medicare Other | Attending: Emergency Medicine | Admitting: Emergency Medicine

## 2023-12-10 DIAGNOSIS — N390 Urinary tract infection, site not specified: Secondary | ICD-10-CM

## 2023-12-10 MED ORDER — CEPHALEXIN 500 MG PO CAPS: 500.0000 mg | ORAL_CAPSULE | Freq: Two times a day (BID) | ORAL | 0 refills | Status: DC

## 2023-12-10 MED ORDER — CEPHALEXIN 500 MG PO CAPS
500.0000 mg | ORAL_CAPSULE | Freq: Once | ORAL | Status: AC
  Administered 2023-12-10: 500 mg via ORAL
  Filled 2023-12-10: qty 1

## 2023-12-10 NOTE — ED Provider Notes (Signed)
 [facility] Provider Note    Event Date/Time   First MD Initiated Contact with Patient 12/10/23 [phone]     (approximate)   History   Dysuria   HPI Jamie Walker is a 63 y.o. female with increased urinary frequency and dysuria starting within the last 24 hours.  Occasional history of UTI in the past but rare.  No flank pain, no abdominal pain.  Denies nausea and vomiting.  Denies fever/chills, denies chest pain and shortness of breath.     Physical Exam   Triage Vital Signs: ED Triage Vitals  Encounter Vitals Group     BP 12/09/23 2132 (!) 170/96     Systolic BP Percentile --      Diastolic BP Percentile --      Pulse Rate 12/09/23 2132 97     Resp 12/09/23 2132 18     Temp 12/09/23 2132 98.1 F (36.7 C)     Temp Source 12/09/23 2132 Oral     SpO2 12/09/23 2132 100 %     Weight 12/09/23 2141 49.9 kg (110 lb)     Height 12/09/23 2141 1.626 m (5\' 4" )     Head Circumference --      Peak Flow --      Pain Score 12/09/23 2141 10     Pain Loc --      Pain Education --      Exclude from Growth Chart --     Most recent vital signs: Vitals:   12/10/23 0229 12/10/23 0633  BP: 133/80 133/73  Pulse: 89 87  Resp: 17 18  Temp: 97.9 F (36.6 C) 97.9 F (36.6 C)  SpO2: 96% 99%    General: Awake, no distress.  CV:  Good peripheral perfusion.  Resp:  Normal effort. Speaking easily and comfortably, no accessory muscle usage nor intercostal retractions.   Abd:  No distention.  No tenderness to palpation of the abdomen, no flank pain tenderness to percussion   ED Results / Procedures / Treatments   Labs (all labs ordered are listed, but only abnormal results are displayed) Labs Reviewed  URINALYSIS, ROUTINE W REFLEX MICROSCOPIC - Abnormal; Notable for the following components:      Result Value   Color, Urine STRAW (*)    APPearance CLEAR (*)    Specific Gravity, Urine 1.001 (*)    Hgb urine dipstick LARGE (*)    Leukocytes,Ua LARGE (*)     Bacteria, UA RARE (*)    All other components within normal limits  URINE CULTURE      PROCEDURES:  Critical Care performed: No  Procedures    IMPRESSION / MDM / ASSESSMENT AND PLAN / ED COURSE  I reviewed the triage vital signs and the nursing notes.                              Differential diagnosis includes, but is not limited to, UTI, pyelonephritis, renal colic  Patient's presentation is most consistent with acute complicated illness / injury requiring diagnostic workup.  Labs/studies ordered: Urinalysis, urine culture  Interventions/Medications given:  Medications  cephALEXin (KEFLEX) capsule 500 mg (has no administration in time range)    (Note:  hospital course my include additional interventions and/or labs/studies not listed above.)   Very well-appearing patient, no concerning systemic symptoms.  Positive urinalysis.  Keflex in the ED, plan for Keflex as outpatient.  Recommended outpatient follow-up with PCP.  I  gave my usual and customary return precautions.         FINAL CLINICAL IMPRESSION(S) / ED DIAGNOSES   Final diagnoses:  Lower urinary tract infectious disease     Rx / DC Orders   ED Discharge Orders          Ordered    cephALEXin (KEFLEX) 500 MG capsule  2 times daily        12/10/23 0641             Note:  This document was prepared using Dragon voice recognition software and may include unintentional dictation errors.   Loleta Rose, MD 12/10/23 [phone]

## 2023-12-10 NOTE — Discharge Instructions (Signed)
You have been seen in the Emergency Department (ED) today for pain when urinating.  Your workup today suggests that you have a urinary tract infection (UTI). ° °Please take your antibiotic as prescribed and over-the-counter pain medication (Tylenol or Motrin) as needed, but no more than recommended on the label instructions.  Drink PLENTY of fluids. ° °Call your regular doctor to schedule the next available appointment to follow up on today?s ED visit, or return immediately to the ED if your pain worsens, you have decreased urine production, develop fever, persistent vomiting, or other symptoms that concern you. ° °

## 2023-12-11 LAB — URINE CULTURE

## 2024-01-15 ENCOUNTER — Inpatient Hospital Stay

## 2024-01-15 ENCOUNTER — Inpatient Hospital Stay: Attending: Oncology | Admitting: Oncology

## 2024-01-15 ENCOUNTER — Encounter: Payer: Self-pay | Admitting: Oncology

## 2024-01-15 VITALS — BP 151/80 | HR 86 | Temp 98.2°F | Resp 16 | Ht 64.0 in | Wt 116.1 lb

## 2024-01-15 DIAGNOSIS — R768 Other specified abnormal immunological findings in serum: Secondary | ICD-10-CM | POA: Diagnosis not present

## 2024-01-15 DIAGNOSIS — R6884 Jaw pain: Secondary | ICD-10-CM | POA: Insufficient documentation

## 2024-01-15 LAB — BASIC METABOLIC PANEL - CANCER CENTER ONLY
Anion gap: 9 (ref 5–15)
BUN: 32 mg/dL — ABNORMAL HIGH (ref 8–23)
CO2: 27 mmol/L (ref 22–32)
Calcium: 9.5 mg/dL (ref 8.9–10.3)
Chloride: 104 mmol/L (ref 98–111)
Creatinine: 1.12 mg/dL — ABNORMAL HIGH (ref 0.44–1.00)
GFR, Estimated: 56 mL/min — ABNORMAL LOW (ref >60)
Glucose, Bld: 102 mg/dL — ABNORMAL HIGH (ref 70–99)
Potassium: 4 mmol/L (ref 3.5–5.1)
Sodium: 140 mmol/L (ref 135–145)

## 2024-01-15 LAB — CBC (CANCER CENTER ONLY)
HCT: 37.6 % (ref 36.0–46.0)
Hemoglobin: 12.7 g/dL (ref 12.0–15.0)
MCH: 27.9 pg (ref 26.0–34.0)
MCHC: 33.8 g/dL (ref 30.0–36.0)
MCV: 82.6 fL (ref 80.0–100.0)
Platelet Count: 174 10*3/uL (ref 150–400)
RBC: 4.55 MIL/uL (ref 3.87–5.11)
RDW: 13 % (ref 11.5–15.5)
WBC Count: 6.6 10*3/uL (ref 4.0–10.5)
nRBC: 0 % (ref 0.0–0.2)

## 2024-01-15 NOTE — Progress Notes (Unsigned)
 [facility]  Telephone:(336) [phone] Fax:(336) [phone]  ID: Jamie Walker OB: 16-Sep-1961  MR#: [id number]  YQM#:[id number]  Patient Care Team: Patrice Paradise, MD as PCP - General (Physician Assistant) Jeralyn Ruths, MD as Consulting Physician (Oncology)  CHIEF COMPLAINT: Elevated IgM.  INTERVAL HISTORY: Patient is a 63 year old female who recently underwent surgery on her jaw and has had persistent problems and pain over the past 8 months.  She was incidentally noted to have an elevated IgM without M spike on lab work.  Patient reports she has been to multiple specialists regarding her jaw pain with no direct answers.  She is extremely frustrated.  She otherwise feels well.  She has no neurologic complaints.  She denies any recent fevers or illnesses.  She has a fair appetite, but denies weight loss.  She denies any other pain.  She has no chest pain, shortness of breath, cough, or hemoptysis.  She denies any nausea, vomiting, constipation, or diarrhea.  She has no urinary complaints.  Patient offers no further specific complaints today.  REVIEW OF SYSTEMS:   Review of Systems  Constitutional: Negative.  Negative for fever, malaise/fatigue and weight loss.  HENT:         Jaw pain.  Respiratory: Negative.  Negative for cough, hemoptysis and shortness of breath.   Cardiovascular: Negative.  Negative for chest pain and leg swelling.  Gastrointestinal: Negative.  Negative for abdominal pain.  Genitourinary: Negative.  Negative for dysuria.  Musculoskeletal: Negative.  Negative for back pain and neck pain.  Skin: Negative.  Negative for rash.  Neurological: Negative.  Negative for dizziness, focal weakness, weakness and headaches.  Psychiatric/Behavioral: Negative.  The patient is not nervous/anxious.     As per HPI. Otherwise, a complete review of systems is negative.  PAST MEDICAL HISTORY: Past Medical History:  Diagnosis Date   Allergy    Arthritis     Chronic kidney disease    Thyroid disease     PAST SURGICAL HISTORY: Past Surgical History:  Procedure Laterality Date   THYROIDECTOMY     TOOTH EXTRACTION      FAMILY HISTORY: Family History  Problem Relation Age of Onset   Cancer Father    Heart disease Father     ADVANCED DIRECTIVES (Y/N):  N  HEALTH MAINTENANCE: Social History   Tobacco Use   Smoking status: Never   Smokeless tobacco: Never  Substance Use Topics   Alcohol use: Never   Drug use: Never     Colonoscopy:  PAP:  Bone density:  Lipid panel:  Allergies  Allergen Reactions   Wasp Venom Anaphylaxis   Wasp Venom Protein Other (See Comments)   Amoxicillin-Pot Clavulanate Other (See Comments)    Bladder problems   Avocado Other (See Comments)   Morphine Nausea And Vomiting    Severe stomach cramping   Piper Other (See Comments)    Current Outpatient Medications  Medication Sig Dispense Refill   Cholecalciferol (VITAMIN D-1000 MAX ST) 25 MCG (1000 UT) tablet Take 1,000 Units by mouth daily.     EPINEPHrine 0.3 mg/0.3 mL IJ SOAJ injection Inject 0.3 mLs into the muscle as needed.     SYNTHROID 75 MCG tablet      No current facility-administered medications for this visit.    OBJECTIVE: Vitals:   01/15/24 1504  BP: (!) 151/80  Pulse: 86  Resp: 16  Temp: 98.2 F (36.8 C)  SpO2: 99%     Body mass index is 19.93 kg/m.  ECOG FS:1 - Symptomatic but completely ambulatory  General: Well-developed, well-nourished, no acute distress. Eyes: Pink conjunctiva, anicteric sclera. HEENT: Normocephalic, moist mucous membranes. Lungs: No audible wheezing or coughing. Heart: Regular rate and rhythm. Abdomen: Soft, nontender, no obvious distention. Musculoskeletal: No edema, cyanosis, or clubbing. Neuro: Alert, answering all questions appropriately. Cranial nerves grossly intact. Skin: No rashes or petechiae noted. Psych: Normal affect. Lymphatics: No cervical, calvicular, axillary or inguinal  LAD.   LAB RESULTS:  Lab Results  Component Value Date   NA 140 01/15/2024   K 4.0 01/15/2024   CL 104 01/15/2024   CO2 27 01/15/2024   GLUCOSE 102 (H) 01/15/2024   BUN 32 (H) 01/15/2024   CREATININE 1.12 (H) 01/15/2024   CALCIUM 9.5 01/15/2024   PROT 7.0 01/09/2019   ALBUMIN 4.0 01/09/2019   AST 19 01/09/2019   ALT 9 01/09/2019   ALKPHOS 65 01/09/2019   BILITOT 0.5 01/09/2019   GFRNONAA 56 (L) 01/15/2024   GFRAA >60 01/09/2019    Lab Results  Component Value Date   WBC 6.6 01/15/2024   NEUTROABS 2.9 01/09/2019   HGB 12.7 01/15/2024   HCT 37.6 01/15/2024   MCV 82.6 01/15/2024   PLT 174 01/15/2024     STUDIES: No results found.  ASSESSMENT: Elevated IgM.  PLAN:    Elevated IgM: Patient also noted to have a decreased IgA.  These were incidentally found.  Pathology from patient's jaw did not indicate any malignancy.  Imaging not consistent with underlying myeloma.  She has no evidence of endorgan damage with a normal CBC, normal calcium level and only mildly elevated creatinine at 1.12.  Repeat myeloma workup is pending at time of dictation.  Patient does not require imaging or bone marrow biopsy at this time.  Return to clinic in 3 weeks with video-assisted telemedicine visit and discussion of her results. Jaw pain: Patient reports she has been to multiple subspecialists with no solution or obvious etiology of her persistent pain.  Patient was given a referral to Dr. Willeen Cass at [facility]NT at her request.  Unclear if maxillofacial MRI would be helpful and will defer to ENT on any imaging needed.  I spent a total of 60 minutes reviewing chart data, face-to-face evaluation with the patient, counseling and coordination of care as detailed above.   Patient expressed understanding and was in agreement with this plan. She also understands that She can call clinic at any time with any questions, concerns, or complaints.    Cancer Staging  No matching staging information  was found for the patient.   Jeralyn Ruths, MD   01/16/2024 10:08 AM    Check

## 2024-01-16 LAB — IGG, IGA, IGM
IgA: 42 mg/dL — ABNORMAL LOW (ref 87–352)
IgG (Immunoglobin G), Serum: 654 mg/dL (ref 586–1602)
IgM (Immunoglobulin M), Srm: 1095 mg/dL — ABNORMAL HIGH (ref 26–217)

## 2024-01-16 LAB — BETA 2 MICROGLOBULIN, SERUM: Beta-2 Microglobulin: 2.2 mg/L (ref 0.6–2.4)

## 2024-01-16 LAB — KAPPA/LAMBDA LIGHT CHAINS
Kappa free light chain: 28.3 mg/L — ABNORMAL HIGH (ref 3.3–19.4)
Kappa, lambda light chain ratio: 1.16 (ref 0.26–1.65)
Lambda free light chains: 24.5 mg/L (ref 5.7–26.3)

## 2024-01-16 LAB — PROTEIN ELECTROPHORESIS, SERUM
A/G Ratio: 1.2 (ref 0.7–1.7)
Albumin ELP: 3.7 g/dL (ref 2.9–4.4)
Alpha-1-Globulin: 0.3 g/dL (ref 0.0–0.4)
Alpha-2-Globulin: 0.7 g/dL (ref 0.4–1.0)
Beta Globulin: 0.9 g/dL (ref 0.7–1.3)
Gamma Globulin: 1.3 g/dL (ref 0.4–1.8)
Globulin, Total: 3.1 g/dL (ref 2.2–3.9)
Total Protein ELP: 6.8 g/dL (ref 6.0–8.5)

## 2024-01-17 ENCOUNTER — Telehealth: Payer: Self-pay

## 2024-01-17 NOTE — Telephone Encounter (Signed)
 Referral has been faxed over to [facility] E[facility] Dr. Willeen Cass.

## 2024-01-21 ENCOUNTER — Ambulatory Visit
Admission: RE | Admit: 2024-01-21 | Discharge: 2024-01-21 | Disposition: A | Discharge status: Home / Self Care | Payer: Medicare Other | Source: Ambulatory Visit | Attending: Neurosurgery | Admitting: Neurosurgery

## 2024-01-21 DIAGNOSIS — D329 Benign neoplasm of meninges, unspecified: Secondary | ICD-10-CM | POA: Diagnosis present

## 2024-01-21 MED ORDER — GADOBUTROL 1 MMOL/ML IV SOLN
5.0000 mL | Freq: Once | INTRAVENOUS | Status: AC | PRN
  Administered 2024-01-21: 5 mL via INTRAVENOUS

## 2024-01-23 ENCOUNTER — Encounter: Payer: Self-pay | Admitting: Oncology

## 2024-01-23 ENCOUNTER — Other Ambulatory Visit: Payer: Self-pay | Admitting: *Deleted

## 2024-01-23 ENCOUNTER — Inpatient Hospital Stay: Attending: Oncology | Admitting: Oncology

## 2024-01-23 VITALS — BP 150/88 | HR 68 | Temp 97.6°F | Resp 18 | Wt 116.0 lb

## 2024-01-23 DIAGNOSIS — D472 Monoclonal gammopathy: Secondary | ICD-10-CM | POA: Diagnosis present

## 2024-01-23 DIAGNOSIS — R6884 Jaw pain: Secondary | ICD-10-CM

## 2024-01-23 DIAGNOSIS — G894 Chronic pain syndrome: Secondary | ICD-10-CM

## 2024-01-23 NOTE — Progress Notes (Unsigned)
 [facility]  Telephone:(336) [phone] Fax:(336) [phone]  ID: Jamie Walker OB: 11/13/1960  MR#: [id number]  VQQ#:[id number]  Patient Care Team: Patrice Paradise, MD as PCP - General (Physician Assistant) Jeralyn Ruths, MD as Consulting Physician (Oncology)  CHIEF COMPLAINT: MGUS.  INTERVAL HISTORY: Patient returns to clinic today as an add-on for further evaluation and discussion of her laboratory results.  She continues to have persistent and significant pain in her right jaw.  She has been to multiple specialists regarding her jaw pain with no direct answers.  She is extremely frustrated.  She otherwise feels well.  She has no neurologic complaints.  She denies any recent fevers or illnesses.  She has a fair appetite, but denies weight loss.  She denies any other pain.  She has no chest pain, shortness of breath, cough, or hemoptysis.  She denies any nausea, vomiting, constipation, or diarrhea.  She has no urinary complaints.  Patient offers no further specific complaints today.  REVIEW OF SYSTEMS:   Review of Systems  Constitutional: Negative.  Negative for fever, malaise/fatigue and weight loss.  HENT:         Jaw pain.  Respiratory: Negative.  Negative for cough, hemoptysis and shortness of breath.   Cardiovascular: Negative.  Negative for chest pain and leg swelling.  Gastrointestinal: Negative.  Negative for abdominal pain.  Genitourinary: Negative.  Negative for dysuria.  Musculoskeletal: Negative.  Negative for back pain and neck pain.  Skin: Negative.  Negative for rash.  Neurological: Negative.  Negative for dizziness, focal weakness, weakness and headaches.  Psychiatric/Behavioral: Negative.  The patient is not nervous/anxious.     As per HPI. Otherwise, a complete review of systems is negative.  PAST MEDICAL HISTORY: Past Medical History:  Diagnosis Date   Allergy    Arthritis    Chronic kidney disease    Thyroid disease     PAST  SURGICAL HISTORY: Past Surgical History:  Procedure Laterality Date   THYROIDECTOMY     TOOTH EXTRACTION      FAMILY HISTORY: Family History  Problem Relation Age of Onset   Cancer Father    Heart disease Father     ADVANCED DIRECTIVES (Y/N):  N  HEALTH MAINTENANCE: Social History   Tobacco Use   Smoking status: Never   Smokeless tobacco: Never  Substance Use Topics   Alcohol use: Never   Drug use: Never     Colonoscopy:  PAP:  Bone density:  Lipid panel:  Allergies  Allergen Reactions   Wasp Venom Anaphylaxis   Wasp Venom Protein Other (See Comments)   Amoxicillin-Pot Clavulanate Other (See Comments)    Bladder problems   Avocado Other (See Comments)   Morphine Nausea And Vomiting    Severe stomach cramping   Piper Other (See Comments)    Current Outpatient Medications  Medication Sig Dispense Refill   Cholecalciferol (VITAMIN D-1000 MAX ST) 25 MCG (1000 UT) tablet Take 1,000 Units by mouth daily.     EPINEPHrine 0.3 mg/0.3 mL IJ SOAJ injection Inject 0.3 mLs into the muscle as needed.     SYNTHROID 75 MCG tablet      No current facility-administered medications for this visit.    OBJECTIVE: There were no vitals filed for this visit.    There is no height or weight on file to calculate BMI.    ECOG FS:1 - Symptomatic but completely ambulatory  General: Well-developed, well-nourished, no acute distress. Eyes: Pink conjunctiva, anicteric sclera. HEENT: Normocephalic, moist mucous  membranes. Lungs: No audible wheezing or coughing. Heart: Regular rate and rhythm. Abdomen: Soft, nontender, no obvious distention. Musculoskeletal: No edema, cyanosis, or clubbing. Neuro: Alert, answering all questions appropriately. Cranial nerves grossly intact. Skin: No rashes or petechiae noted. Psych: Normal affect.  LAB RESULTS:  Lab Results  Component Value Date   NA 140 01/15/2024   K 4.0 01/15/2024   CL 104 01/15/2024   CO2 27 01/15/2024   GLUCOSE 102 (H)  01/15/2024   BUN 32 (H) 01/15/2024   CREATININE 1.12 (H) 01/15/2024   CALCIUM 9.5 01/15/2024   PROT 7.0 01/09/2019   ALBUMIN 4.0 01/09/2019   AST 19 01/09/2019   ALT 9 01/09/2019   ALKPHOS 65 01/09/2019   BILITOT 0.5 01/09/2019   GFRNONAA 56 (L) 01/15/2024   GFRAA >60 01/09/2019    Lab Results  Component Value Date   WBC 6.6 01/15/2024   NEUTROABS 2.9 01/09/2019   HGB 12.7 01/15/2024   HCT 37.6 01/15/2024   MCV 82.6 01/15/2024   PLT 174 01/15/2024     STUDIES: No results found.  ASSESSMENT: MGUS.  PLAN:    Elevated IgM: Chronic and unchanged.  Patient has an elevated IgM level of 1095.  Kappa chains are also mildly elevated at 28.3.  She does not have an M spike on SPEP.  Patient also noted to have a decreased IgA.  These were incidentally found.  Pathology from patient's jaw did not indicate any malignancy.  Imaging not consistent with underlying myeloma.  She has no evidence of endorgan damage with a normal CBC, normal calcium level and only mildly elevated creatinine at 1.12.  She does not require imaging or bone marrow biopsy at this time.  Return to clinic in 6 months with repeat laboratory work and further evaluation. Jaw pain: Patient reports she has been to multiple subspecialists with no solution or obvious etiology of her persistent pain.  Referral to ENT was declined, therefore patient was given a referral to [facility]NT for further evaluation.  Patient was also given a referral to pain clinic, but she has already seen them several times and reports they are unable to help.  She has been instructed to keep her previously scheduled follow-up appointment with oral maxillofacial surgery at [facility].   I spent a total of 30 minutes reviewing chart data, face-to-face evaluation with the patient, counseling and coordination of care as detailed above.   Patient expressed understanding and was in agreement with this plan. She also understands that She can call clinic at any time with  any questions, concerns, or complaints.    Jeralyn Ruths, MD   01/23/2024 1:38 PM    Check

## 2024-01-23 NOTE — Progress Notes (Unsigned)
 Patient is having severe pain, rates it at a 8. She wants to know why Dr. Willeen Cass want see her.

## 2024-01-24 ENCOUNTER — Encounter: Payer: Self-pay | Admitting: *Deleted

## 2024-01-24 NOTE — Patient Instructions (Signed)
 Referral faxed to [facility]NT.

## 2024-02-05 ENCOUNTER — Telehealth: Admitting: Oncology

## 2024-02-07 ENCOUNTER — Other Ambulatory Visit: Payer: Self-pay | Admitting: Physician Assistant

## 2024-02-07 DIAGNOSIS — Z1231 Encounter for screening mammogram for malignant neoplasm of breast: Secondary | ICD-10-CM

## 2024-02-18 ENCOUNTER — Encounter: Payer: Self-pay | Admitting: Neurosurgery

## 2024-02-18 ENCOUNTER — Other Ambulatory Visit: Payer: Self-pay | Admitting: Neurosurgery

## 2024-02-18 DIAGNOSIS — D329 Benign neoplasm of meninges, unspecified: Secondary | ICD-10-CM

## 2024-02-19 ENCOUNTER — Other Ambulatory Visit: Payer: Self-pay | Admitting: Rheumatology

## 2024-02-19 DIAGNOSIS — M272 Inflammatory conditions of jaws: Secondary | ICD-10-CM

## 2024-02-19 DIAGNOSIS — G8929 Other chronic pain: Secondary | ICD-10-CM

## 2024-02-24 ENCOUNTER — Ambulatory Visit
Admission: RE | Admit: 2024-02-24 | Discharge: 2024-02-24 | Disposition: A | Discharge status: Home / Self Care | Source: Ambulatory Visit | Attending: Rheumatology | Admitting: Rheumatology

## 2024-02-24 DIAGNOSIS — G8929 Other chronic pain: Secondary | ICD-10-CM

## 2024-02-24 DIAGNOSIS — M272 Inflammatory conditions of jaws: Secondary | ICD-10-CM

## 2024-02-28 ENCOUNTER — Ambulatory Visit
Admission: RE | Admit: 2024-02-28 | Discharge: 2024-02-28 | Disposition: A | Discharge status: Home / Self Care | Source: Ambulatory Visit | Attending: Physician Assistant | Admitting: Physician Assistant

## 2024-02-28 DIAGNOSIS — Z1231 Encounter for screening mammogram for malignant neoplasm of breast: Secondary | ICD-10-CM | POA: Insufficient documentation

## 2024-03-11 LAB — COLOGUARD: COLOGUARD: NEGATIVE

## 2024-03-12 ENCOUNTER — Telehealth (INDEPENDENT_AMBULATORY_CARE_PROVIDER_SITE_OTHER): Payer: Self-pay | Admitting: Otolaryngology

## 2024-03-12 NOTE — Telephone Encounter (Signed)
 LVM to confirm appt & location [phone] afm

## 2024-03-13 ENCOUNTER — Encounter (INDEPENDENT_AMBULATORY_CARE_PROVIDER_SITE_OTHER): Payer: Self-pay | Admitting: Otolaryngology

## 2024-03-13 ENCOUNTER — Ambulatory Visit (INDEPENDENT_AMBULATORY_CARE_PROVIDER_SITE_OTHER): Admitting: Otolaryngology

## 2024-03-13 VITALS — BP 160/83 | HR 86

## 2024-03-13 DIAGNOSIS — K219 Gastro-esophageal reflux disease without esophagitis: Secondary | ICD-10-CM | POA: Diagnosis not present

## 2024-03-13 DIAGNOSIS — R519 Headache, unspecified: Secondary | ICD-10-CM

## 2024-03-13 NOTE — Progress Notes (Signed)
 ENT CONSULT:  Reason for Consult: facial pain    HPI: Discussed the use of AI scribe software for clinical note transcription with the patient, who gave verbal consent to proceed.  History of Present Illness Jamie Walker is a 63 year old female who presents with severe right facial pain/jaw pain and suspected salivary gland calcification on dental scans. She was referred by Dr. Juleen Oakland following bone graft removals and persistent jaw pain.  She experiences severe pain and pressure in the soft tissue of her upper jaw, particularly in the posterior region. This pain began before her tooth extractions in October of the previous year and has persisted despite the removal of bone grafts. The pain is described as 'incredible' and feels like swelling or a lump that becomes extremely painful before releasing. It occurs daily and is exacerbated when lying down.  She has a history of bone graft removals following tooth extractions, which initially eased some pain but did not resolve it. Multiple specialists, including a periodontist, have noted calcification in her salivary glands, specifically the parotid gland. Despite being referred to ENT specialists three times, she was not accepted for evaluation.  Various medications, including gabapentin, have been ineffective in managing her pain. A facial pain specialist diagnosed her with neuropathy but ruled out trigeminal neuralgia.  Her teeth began to calcify following the onset of pain in the salivary area, leading to their extraction. She also experiences ulcerations in the back of her mouth and has a history of ossifying fibroma, for which she underwent a biopsy and removal of sores on her gums.  Her blood pressure is consistently high, around 160, due to the pain. Blood tests have shown abnormal protein markers, but rheumatologic conditions have been ruled out. She has been seen by rheumatologists and oncologists, who are uncertain about the cause of  these abnormalities. She has also seen Neurology and NSG.   Records Reviewed:  Visit with [facility] 02/27/24 - diagnosed with trigeminal neuralgia  Location R post max pain  Onset Aug 2024  Pptg factors R post maxillary gingiva pain  RCT Extraction #2 & #3  bone graft  pain worsened  Bone graft removed Dec & Feb 2025  helped a little  2 R post max alveolarplasty & biopsy (fibroma)  Quality Dull & aching  Intensity 8  Frequency / duration Const.  Triggering factors  ?g factors Jaw functions, sleeping supine, stress  ?g factors Chewing. (Opioids, gabapentin, carbamazepine, amitriptyline, & nerve blk did not help)  Assoc. factors H/o R V3 pain in 2023 mx w steroid, no recurrence Nov 2024 dx Osteitis R post max   Report: A cone beam computed tomography scan is for evaluation of maxillofacial region to rule out dental etiology of right facial pain. Patient history includes right maxillary pain of unknown etiology and possible trigeminal neuralgia. The patient was scanned with the [facility] [phone]   scanner using a 16x16x10 cm field-of-view, spanning from the sphenoid sinus superiorly to epiglottis inferiorly.  Historical images include TMJ MRI (02/24/2024), MDCT (09/14/2023) resulting in normal studies. MRI brain (01/21/2024) showed a vessel in the root entry zone of trigeminal ganglion. If the vessel is pressing on the nerve  that is often cited as a cause of trigeminal neuralgia.  Findings: Mild to moderate periodontal bone loss is seen with the mandibular anterior teeth. Tooth #25 shows a loss of root structure in the mesiobuccal line angle of the root at its middle third, involving the pulp canal, consistent with external root  resorption.  Tooth #18 shows a vertical wedge-shaped bone defect distally and extending till the cervical-middle third junction of the root, consistent with distal periodontal defect.  Right TMJ: Condyle show smooth and continuous  cortical borders. Mild flattening is noted in the anteriosuperior surface of the condyle. The articular eminence and the glenoid fossa are normal. The condyle is positioned centrally within the fossa at the maximum intercuspation. Disc space is normal.  Left TMJ: Condyle show smooth and continuous cortical borders. Mild flattening is noted in the anteriosuperior surface of the condyle. The articular eminence and the glenoid fossa are normal. The condyle is positioned centrally within the fossa at the maximum intercuspation. Disc space is normal.  Foramen Vesalius is noted in the left side and presenting unilaterally. Foramen rotundum is normal. Right foramen ovale appears relatively narrower in the width compared to the left. Foramen Spinosum appears smaller in the right. Superior orbital fissure is not included in the current volume of the scan. Some studies suggest a correlation of dimensions of foramen ovale and rotundum with trigeminal neuralgia, but not definitive of its diagnosis. In this case it is mostly not the cause of the neuralgia  Incidental Findings: Multiple, punctate soft tissue calcifications are noted in the palatine and lingual tonsils, consistent with tonsilolith.  Right maxillary alveolar ridge is atrophic with pneumatization of right maxillary sinus.  Degenerative changes in the cervical spine including reduction in the joint space, osteophyte formation, calcification of ligaments are noted.   Evaluation of the total image volume revealed no other significant findings.  Impression: Normal right maxilla Tooth #25: External root resorption Tooth #18: Distal periodontal bone defect. TMJs: Adaptive remodeling Tonsilloliths Degenerative changes in the cervical spine    Past Medical History:  Diagnosis Date   Allergy    Arthritis    Chronic kidney disease    Thyroid disease     Past Surgical History:  Procedure Laterality Date   THYROIDECTOMY      TOOTH EXTRACTION      Family History  Problem Relation Age of Onset   Cancer Father    Heart disease Father     Social History:  reports that she has never smoked. She has never used smokeless tobacco. She reports that she does not drink alcohol and does not use drugs.  Allergies:  Allergies  Allergen Reactions   Articaine-Epinephrine Palpitations    SEPTOCAINE   Wasp Venom Anaphylaxis   Wasp Venom Protein Other (See Comments)   Amoxicillin-Pot Clavulanate Other (See Comments)    Bladder problems   Avocado Other (See Comments)   Cephalosporins    Morphine  Nausea And Vomiting    Severe stomach cramping   Piper Other (See Comments)    Pepper, Bell peppers    Medications: I have reviewed the patient's current medications.  The PMH, PSH, Medications, Allergies, and SH were reviewed and updated.  ROS: Constitutional: Negative for fever, weight loss and weight gain. Cardiovascular: Negative for chest pain and dyspnea on exertion. Respiratory: Is not experiencing shortness of breath at rest. Gastrointestinal: Negative for nausea and vomiting. Neurological: Negative for headaches. Psychiatric: The patient is not nervous/anxious  Blood pressure (!) 160/83, pulse 86, SpO2 97%. There is no height or weight on file to calculate BMI.  PHYSICAL EXAM:  Exam: General: Well-developed, well-nourished Respiratory Respiratory effort: Equal inspiration and expiration without stridor Cardiovascular Peripheral Vascular: Warm extremities with equal color/perfusion Eyes: No nystagmus with equal extraocular motion bilaterally Neuro/Psych/Balance: Patient oriented to person, place, and time; Appropriate mood  and affect; Gait is intact with no imbalance; Cranial nerves I-XII are intact Head and Face Inspection: Normocephalic and atraumatic without mass or lesion Palpation: Facial skeleton intact without bony stepoffs Salivary Glands: No mass or tenderness Facial Strength: Facial motility  symmetric and full bilaterally ENT Pinna: External ear intact and fully developed External canal: Canal is patent with intact skin Tympanic Membrane: Clear and mobile External Nose: No scar or anatomic deformity Internal Nose: Septum is  . No polyp, or purulence. Mucosal edema and erythema present.  Bilateral inferior turbinate hypertrophy.  Lips, Teeth, and gums: Mucosa and teeth intact and viable TMJ: No pain to palpation with full mobility Oral cavity/oropharynx: No erythema or exudate, no lesions present 1+ tonsils b/l with tonsil cysts Nasopharynx: No mass or lesion with intact mucosa Hypopharynx: Intact mucosa without pooling of secretions Larynx Glottic: Full true vocal cord mobility without lesion or mass Supraglottic: Normal appearing epiglottis and AE folds Interarytenoid Space: Moderate pachydermia&edema Subglottic Space: Patent without lesion or edema Neck Neck and Trachea: Midline trachea without mass or lesion Thyroid: No mass or nodularity Lymphatics: No lymphadenopathy  Procedure: Preoperative diagnosis: facial pain and posterior throat pain   Postoperative diagnosis:   Same + GERD LPR  Procedure: Flexible fiberoptic laryngoscopy  Surgeon: Cerys Winget, MD  Anesthesia: Topical lidocaine and Afrin Complications: None Condition is stable throughout exam  Indications and consent:  The patient presents to the clinic with above symptoms. Indirect laryngoscopy view was incomplete. Thus it was recommended that they undergo a flexible fiberoptic laryngoscopy. All of the risks, benefits, and potential complications were reviewed with the patient preoperatively and verbal informed consent was obtained.  Procedure: The patient was seated upright in the clinic. Topical lidocaine and Afrin were applied to the nasal cavity. After adequate anesthesia had occurred, I then proceeded to pass the flexible telescope into the nasal cavity. The nasal cavity was patent without  rhinorrhea or polyp. The nasopharynx was also patent without mass or lesion. The base of tongue was visualized and was normal. There were no signs of pooling of secretions in the piriform sinuses. The true vocal folds were mobile bilaterally. There were no signs of glottic or supraglottic mucosal lesion or mass. There was moderate interarytenoid pachydermia and post cricoid edema. The telescope was then slowly withdrawn and the patient tolerated the procedure throughout.   Studies Reviewed (only reports available): CT of the jaw done via Oral Surgery Office - Multiple punctate soft tissue calcifications in palatine and lingual tonsils = tonsiloliths  Right maxillary alveolar ridge is atrophic with right maxillary sinus   Degenerative changes in the cervical spine including reduction in the joint space, osteophyte formation, calcification of ligament are noted   Assessment/Plan: Encounter Diagnosis  Name Primary?   Facial pain Yes    Assessment and Plan Assessment & Plan Pain in jaw and soft tissue of the right face Chronic severe upper jaw soft tissue pain for 10 months, exacerbated by dental procedures. Etiology unclear. Differential includes neuropathy, vascular issues, vs dental in origin. Neurological causes largely ruled out and other conditions have been ruled out as well since she had seen Neurology, NSG, Rheumatology, OMFS. Pain specialist recommended acupuncture. Here because she had calcification of parotid glands on the scan at the dental office. We discussed today that her parotid glands appear and feel normal on exam. Flexible scope was unrevealing with overall normal findings. She is also concerned that right facial pain could be due to a sinus infection.  - Order CT  of the neck to evaluate for calcifications in salivary glands and assess right maxillary sinus area for chronic inflammation. - Continue to see facial pain specialist.  Calcification in salivary glands Possible  parotid gland calculi? Based on report from the patient, dental scans showed parotid gland calcification. Symptoms atypical for parotid gland issues. CT scan to evaluate calcifications and sinus inflammation. - Order CT of the neck to evaluate   Ulcers in mouth Recurrent ulcerations in the soft tissue area. Previous biopsy indicated ossifying fibroma. Current cause unclear. No lesions on exam today  - continue to monitor     Follow-up Plan to evaluate CT scan results and determine further management. - Schedule CT scan through [facility]. - Follow up after CT scan results are available to discuss further management.    Thank you for allowing me to participate in the care of this patient. Please do not hesitate to contact me with any questions or concerns.   Artice Last, MD Otolaryngology [facility] Phone: [phone] Fax: [phone]    03/13/2024, 2:59 PM

## 2024-03-13 NOTE — Patient Instructions (Signed)
 Sialoadenitis (Salivary Gland Infection)    Salivary glands make saliva, or spit. An infection in these glands can make the glands swell and hurt.  An infection can happen when bacteria gets into the gland. This is more common in people who have diabetes, poor tooth care, or stones in these glands. Bacteria can build up and cause an infection if you don't get enough fluids. It can also happen if the flow of saliva gets blocked by a small stone in the gland. A virus can also cause an infection.  Your care depends on the cause. If the problem is caused by bacteria, your doctor may prescribe antibiotics.  Home treatment may help. You can drink more fluids or suck on sugar-free lemon drops to increase the flow of saliva.  Follow-up care is a key part of your treatment and safety. Be sure to make and go to all appointments, and call your doctor if you are having problems. It's also a good idea to know your test results and keep a list of the medicines you take.  How can you care for yourself at home?  If your doctor prescribed antibiotics, take them as directed. Do not stop taking them just because you feel better. You need to take the full course of antibiotics.  Take an over-the-counter pain medicine if needed, such as acetaminophen (Tylenol), ibuprofen (Advil, Motrin), or naproxen (Aleve). Be safe with medicines. Read and follow all instructions on the label.  Do not take two or more pain medicines at the same time unless the doctor told you to. Many pain medicines have acetaminophen, which is Tylenol. Too much acetaminophen (Tylenol) can be harmful.  Drink plenty of fluids. If you have kidney, heart, or liver disease and have to limit fluids, talk with your doctor before you increase the amount of fluids you drink.  Put an ice or heat pack (whichever feels better) on the swollen jaw for 10 to 20 minutes at a time. Put a thin cloth between the ice or heat pack and your skin.  Suck on ice  chips or ice treats such as sugar-free flavoured ice pops. Eat soft foods that do not have to be chewed much.  Use sugar-free gum or candies such as lemon drops. They increase saliva.  Avoid over-the-counter medicines that can give you a dry mouth. These medicines include antihistamines, such as diphenhydramine (Benadryl) or chlorpheniramine.  Gently massage the infected gland.

## 2024-05-13 ENCOUNTER — Ambulatory Visit (INDEPENDENT_AMBULATORY_CARE_PROVIDER_SITE_OTHER): Admitting: Otolaryngology

## 2024-06-14 NOTE — Progress Notes (Signed)
 Chief Complaint: Chief Complaint  Patient presents with  . Urinary Symptoms    X 3 days Increased frequency and urgency Pain and burning with voiding Pelvic pressure Denies back/flank pain Denies difficulty starting stream or feeling like not completely emptying Urine has cloudy appearance Vaginal discharge and itching Vaginal soreness and odor Px fluconazole; not helping symptoms    History of Present Illness Jamie Walker is a 63 year old female who presents with burning sensation during urination and vaginal discharge.  Symptoms have been ongoing for few days.  She denies any abdominal pain fevers nausea vomiting or diarrhea.      Past Medical History: Past Medical History:  Diagnosis Date  . Allergy 2018   wasp stings  . Arthritis   . Chronic kidney disease   . IT band syndrome   . Scoliosis   . Thyroid disease     Past Surgical History: Past Surgical History:  Procedure Laterality Date  . OTHER SURGERY Right 12/26/2021   bone graft in mouth - right side  . BONE GRAFT MANDIBLE Right 12/18/2023  . THYROIDECTOMY    . THYROIDECTOMY SUBSTERNAL      Past Family History: Family History  Problem Relation Age of Onset  . No Known Problems Mother   . Prostate cancer Father   . Leukemia Brother   . Colon cancer Neg Hx   . Colon polyps Neg Hx   . Breast cancer Neg Hx   . Diabetes type II Neg Hx   . Hyperlipidemia (Elevated cholesterol) Neg Hx   . Coronary Artery Disease (Blocked arteries around heart) Neg Hx     Medications: Current Outpatient Medications  Medication Sig Dispense Refill  . acetaminophen (TYLENOL) 500 MG tablet Take 2 tablets by mouth every 8 (eight) hours as needed for Pain    . cholecalciferol (VITAMIN D3) 1000 unit tablet Take 1,000 Units by mouth once daily    . EPINEPHrine (EPIPEN) 0.3 mg/0.3 mL auto-injector Inject 0.3 mLs (0.3 mg total) into the muscle as needed for Anaphylaxis 2 each 2  . fluconazole (DIFLUCAN) 50 MG tablet Take 100  mg by mouth once daily    . ibuprofen (MOTRIN) 200 MG tablet Take 200 mg by mouth every 6 (six) hours as needed for Pain    . SYNTHROID 75 mcg tablet Take 1 tablet (75 mcg total) by mouth once daily Take on an empty stomach with a glass of water at least 30-60 minutes before breakfast. 90 tablet 2  . DULoxetine (CYMBALTA) 30 MG DR capsule Take 1 capsule (30 mg total) by mouth once daily for 90 days (Patient not taking: Reported on 06/14/2024) 90 capsule 0   No current facility-administered medications for this visit.    Allergies: Allergies  Allergen Reactions  . Venom-Wasp Other (See Comments)  . Wasp Venom Anaphylaxis  . Augmentin [Amoxicillin-Pot Clavulanate] Other (See Comments)    Bladder problems  . Avocado Abdominal Pain  . Black Pepper Other (See Comments)    Green Pepper   . Morphine  Nausea And Vomiting    Severe stomach cramping  . Pepper (Genus Capsicum) Abdominal Pain  . Septocaine [Articaine-Epinephrine Bitart] Palpitations     Review of Systems:  A comprehensive 14 point ROS was performed, reviewed by me today, and the pertinent orthopaedic findings are documented in the HPI.   Exam: BP (!) 143/80   Pulse 106   Temp 36.8 C (98.2 F) (Oral)   Ht 162.6 cm (5' 4)   Wt 57.6 kg (  127 lb)   SpO2 98%   BMI 21.80 kg/m  General/Constitutional: The patient appears to be well-nourished, well-developed, and in no acute distress. Neuro/Psych: Normal mood and affect, oriented to person, place and time. Eyes: Non-icteric.  Pupils are equal, round, and reactive to light, and exhibit synchronous movement. ENT: Unremarkable. Lymphatic: No palpable adenopathy. Respiratory: Non-labored breathing Cardiovascular: No edema, swelling or tenderness, except as noted in detailed exam. Integumentary: No impressive skin lesions present, except as noted in detailed exam. Musculoskeletal: Unremarkable, except as noted in detailed exam.     Impression: Dysuria [R30.0] Dysuria   (primary encounter diagnosis)  Plan:  Assessment & Plan Urinary tract infection Signs and symptoms consistent with UTI. Urine culture performed to confirm bacteria and Cefalexin appropriateness. Tolerates Cefalexin despite sensitivity to other antibiotics. Informed about yeast infection risk and preventive measures. - Prescribe Cefalexin. - Perform urine culture. - Advise probiotic yogurt consumption.  Vaginal discharge, etiology unclear Yellow vaginal discharge. Wet prep negative for yeast, clue cells, or bacterial vaginitis.    This note was generated in part with voice recognition software and I apologize for any typographical errors that were not detected and corrected.   Debby Lonni Amber MPA-C

## 2024-06-16 NOTE — Progress Notes (Signed)
 Obstetrics & Gynecology Office Visit     HPI:  Jamie Walker is a 63 y.o. female who presents for an established patient office visit for evaluation of an abnormal vaginal itching, discharge and irritation. She was seen at urgent care 2 days ago, treated for UTI and given diflucan for possible yeast. Her urine culture negative for bacterial growth. She has not had any improvement since taking antibiotics or diflucan.   Reports that her husband does have HSV1 but avoids contact during an outbreak.   Symptoms have been present since last Wednesday. Vaginal symptoms: discharge, itching, irritation Duration: constant Characteristics: tan/brown discharge, burning sensation Associated signs and symptoms: none Prior treatments and evaluations: diflucan, Keflex   Contraception: postmenopausal Sexual history: denies new sexual partner  Current Outpatient Medications  Medication Sig Dispense Refill  . cephalexin  (KEFLEX ) 500 MG capsule Take 1 capsule (500 mg total) by mouth 4 (four) times daily for 5 days 20 capsule 0  . EPINEPHrine (EPIPEN) 0.3 mg/0.3 mL auto-injector Inject 0.3 mLs (0.3 mg total) into the muscle as needed for Anaphylaxis 2 each 2  . SYNTHROID 75 mcg tablet Take 1 tablet (75 mcg total) by mouth once daily Take on an empty stomach with a glass of water at least 30-60 minutes before breakfast. 90 tablet 2  . gabapentin (NEURONTIN) 100 MG capsule Take 1 capsule (100 mg total) by mouth 3 (three) times daily as needed (pain) 30 capsule 0  . valACYclovir (VALTREX) 1000 MG tablet Take 1 tablet (1,000 mg total) by mouth 2 (two) times daily for 10 days 20 tablet 0   No current facility-administered medications for this visit.   Allergies  Allergen Reactions  . Venom-Wasp Other (See Comments)  . Wasp Venom Anaphylaxis  . Augmentin [Amoxicillin-Pot Clavulanate] Other (See Comments)    Bladder problems  . Avocado Abdominal Pain  . Black Pepper Other (See Comments)    Green Pepper    . Morphine  Nausea And Vomiting    Severe stomach cramping  . Pepper (Genus Capsicum) Abdominal Pain  . Septocaine [Articaine-Epinephrine Bitart] Palpitations   Past Medical History:  Diagnosis Date  . Allergy 2018   wasp stings  . Arthritis   . Chronic kidney disease   . IT band syndrome   . Scoliosis   . Thyroid disease    Past Surgical History:  Procedure Laterality Date  . OTHER SURGERY Right 12/26/2021   bone graft in mouth - right side  . BONE GRAFT MANDIBLE Right 12/18/2023  . THYROIDECTOMY    . THYROIDECTOMY SUBSTERNAL     OB History  Gravida Para Term Preterm AB Living  1 1 1   1   SAB IAB Ectopic Molar Multiple Live Births           # Outcome Date GA Lbr Len/2nd Weight Sex Type Anes PTL Lv  1 Term      Vag-Spont      Family History  Problem Relation Name Age of Onset  . No Known Problems Mother    . Prostate cancer Father Father   . Leukemia Brother    . Colon cancer Neg Hx    . Colon polyps Neg Hx    . Breast cancer Neg Hx    . Diabetes type II Neg Hx    . Hyperlipidemia (Elevated cholesterol) Neg Hx    . Coronary Artery Disease (Blocked arteries around heart) Neg Hx     Social History   Tobacco Use  . Smoking status: Never  Passive exposure: Never  . Smokeless tobacco: Never  Vaping Use  . Vaping status: Never Used  Substance Use Topics  . Alcohol use: Not Currently  . Drug use: Never     [place] of Systems:    ROS: see HPI for pertinent positives and negatives, otherwise a 10 system review is negative.   No SOB, no palpitations or chest pain, no new lower extremity edema, no nausea or vomiting.   Exam:   Vitals:   06/16/24 0908  BP: (!) 140/79   Body mass index is 21.52 kg/m.  Physical Exam Constitutional:      General: She is not in acute distress. Genitourinary:     Right Labia: lesions.     Left Labia: lesions.         Pelvic Tanner Score: 5/5.    Vaginal ulceration present.     Moderate vaginal atrophy  present. HENT:     Head: Normocephalic.  Cardiovascular:     Rate and Rhythm: Normal rate and regular rhythm.  Pulmonary:     Effort: Pulmonary effort is normal.  Musculoskeletal:        General: Normal range of motion.  Neurological:     Mental Status: She is alert and oriented to person, place, and time.  Psychiatric:        Mood and Affect: Mood normal.        Behavior: Behavior normal.        Thought Content: Thought content normal.        Judgment: Judgment normal.  Exam conducted with a chaperone present.     Chaperone present for pelvic exam. Examination chaperoned by N. Saligan, CMA  Assessment/Plan:   1. Acute vaginitis  -     Wet Prep  2. Routine screening for STI (sexually transmitted infection)  -     HSV Culture and Typing - [facility] -     NuSwab Vaginitis Plus (VG+) - [facility]  3. Dysuria Will rule out infection.   -     Urinalysis w/Microscopic -     Urine Culture, Routine - [facility]  4. Vaginal lesion On physical exam, lesions closely resemble HSV outbreak. Will go ahead and treat with Valtrex. Will follow up on blood work and swab once they result. Information given via MyChart. Discussed that it is still possible to spread the infection even when no ulcers are present.   -     HSV 1 and 2 Ab, IgG - [facility] -     valACYclovir (VALTREX) 1000 MG tablet; Take 1 tablet (1,000 mg total) by mouth 2 (two) times daily for 10 days  5. Vaginal Dryness: We discussed the common discomforts with vulvovaginal atrophy, including vaginal dryness, dysparunia, infections. We discussed the pathophysiology of this condition, as well as possible treatments, including expectant management, vaginal estrogen, osphena. The patient was given handouts about the condition and the possible treatment options.   Addendum: Patient aware of negative blood testing but also aware that it may take up to 3 months after exposure for positive testing. She continues to have severe pain,  gabapentin ordered.   Orders Placed This Encounter  Procedures  . HSV Culture and Typing - [facility]    Release to patient:   Immediate  . NuSwab Vaginitis Plus (VG+) - [facility]    Release to patient:   Immediate  . Urine Culture, Routine - [facility]    Release to patient:   Immediate  . Wet Prep    Release to patient:  Immediate  . Urinalysis w/Microscopic    Release to patient:   Immediate  . HSV 1 and 2 Ab, IgG - [facility]    Release to patient:   Immediate     Return if symptoms worsen or fail to improve.   A total of 31 minutes was spent on the date of service performing evaluation and management activities related to this patient encounter. This includes both face-to-face time with the patient and non-face-to-face time spent on the same day.   Attestation Statement:   I personally performed the service, non-incident to. (WP)   Comer Ellen, FNP [facility]

## 2024-06-26 ENCOUNTER — Emergency Department
Admission: EM | Admit: 2024-06-26 | Discharge: 2024-06-26 | Disposition: A | Discharge status: Home / Self Care | Source: Ambulatory Visit | Attending: Emergency Medicine | Admitting: Emergency Medicine

## 2024-06-26 ENCOUNTER — Encounter: Payer: Self-pay | Admitting: Emergency Medicine

## 2024-06-26 ENCOUNTER — Other Ambulatory Visit: Payer: Self-pay

## 2024-06-26 ENCOUNTER — Emergency Department

## 2024-06-26 DIAGNOSIS — K8020 Calculus of gallbladder without cholecystitis without obstruction: Secondary | ICD-10-CM | POA: Insufficient documentation

## 2024-06-26 DIAGNOSIS — N189 Chronic kidney disease, unspecified: Secondary | ICD-10-CM | POA: Diagnosis not present

## 2024-06-26 DIAGNOSIS — R3911 Hesitancy of micturition: Secondary | ICD-10-CM | POA: Insufficient documentation

## 2024-06-26 DIAGNOSIS — M545 Low back pain, unspecified: Secondary | ICD-10-CM | POA: Diagnosis present

## 2024-06-26 LAB — URINALYSIS, ROUTINE W REFLEX MICROSCOPIC
Bacteria, UA: NONE SEEN
Bilirubin Urine: NEGATIVE
Glucose, UA: NEGATIVE mg/dL
Ketones, ur: NEGATIVE mg/dL
Leukocytes,Ua: NEGATIVE
Nitrite: NEGATIVE
Protein, ur: NEGATIVE mg/dL
Specific Gravity, Urine: 1.005 (ref 1.005–1.030)
pH: 6 (ref 5.0–8.0)

## 2024-06-26 LAB — COMPREHENSIVE METABOLIC PANEL WITH GFR
ALT: 14 U/L (ref 0–44)
AST: 18 U/L (ref 15–41)
Albumin: 3.7 g/dL (ref 3.5–5.0)
Alkaline Phosphatase: 70 U/L (ref 38–126)
Anion gap: 13 (ref 5–15)
BUN: 17 mg/dL (ref 8–23)
CO2: 26 mmol/L (ref 22–32)
Calcium: 9.1 mg/dL (ref 8.9–10.3)
Chloride: 100 mmol/L (ref 98–111)
Creatinine, Ser: 1.21 mg/dL — ABNORMAL HIGH (ref 0.44–1.00)
GFR, Estimated: 51 mL/min — ABNORMAL LOW (ref >60)
Glucose, Bld: 94 mg/dL (ref 70–99)
Potassium: 4.6 mmol/L (ref 3.5–5.1)
Sodium: 139 mmol/L (ref 135–145)
Total Bilirubin: 0.9 mg/dL (ref 0.0–1.2)
Total Protein: 7 g/dL (ref 6.5–8.1)

## 2024-06-26 LAB — CBC
HCT: 38.2 % (ref 36.0–46.0)
Hemoglobin: 12.7 g/dL (ref 12.0–15.0)
MCH: 27.3 pg (ref 26.0–34.0)
MCHC: 33.2 g/dL (ref 30.0–36.0)
MCV: 82.2 fL (ref 80.0–100.0)
Platelets: 199 K/uL (ref 150–400)
RBC: 4.65 MIL/uL (ref 3.87–5.11)
RDW: 13.6 % (ref 11.5–15.5)
WBC: 8.3 K/uL (ref 4.0–10.5)
nRBC: 0 % (ref 0.0–0.2)

## 2024-06-26 LAB — LIPASE, BLOOD: Lipase: 53 U/L — ABNORMAL HIGH (ref 11–51)

## 2024-06-26 MED ORDER — IOHEXOL 300 MG/ML  SOLN
100.0000 mL | Freq: Once | INTRAMUSCULAR | Status: AC | PRN
  Administered 2024-06-26: 100 mL via INTRAVENOUS

## 2024-06-26 MED ORDER — SODIUM CHLORIDE 0.9 % IV BOLUS
1000.0000 mL | Freq: Once | INTRAVENOUS | Status: AC
  Administered 2024-06-26: 1000 mL via INTRAVENOUS

## 2024-06-26 NOTE — ED Triage Notes (Signed)
 Pt reports mid lower back pain and trouble with urination. PT reports it feels like something is pushing on my bladder. Pt reports this has been going on since Sunday.

## 2024-06-26 NOTE — ED Notes (Signed)
 Pt ambulatory to bathroom w steady gate. Returned to bed without incident. Tv remote given. Pt appears anxious.

## 2024-06-26 NOTE — ED Provider Notes (Signed)
 [facility] Provider Note    Event Date/Time   First MD Initiated Contact with Patient 06/26/24 1053     (approximate)   History   Chief Complaint: Back Pain   HPI  Jamie Walker is a 63 y.o. female with a history of chronic low back pain, CKD who comes ED complaining of worse low back pain bilaterally for the past 4 days, associated with a feeling of difficulty urinating.  Denies urgency but does endorse some dysuria.  No fever or chills.  No vomiting.  Has had decreased appetite during this time.        Past Medical History:  Diagnosis Date   Allergy    Arthritis    Chronic kidney disease    Thyroid disease     Current Outpatient Rx   Order #: [id number] Class: Historical Med   Order #: [id number] Class: Historical Med   Order #: [id number] Class: Historical Med   Order #: [id number] Class: Historical Med    Past Surgical History:  Procedure Laterality Date   THYROIDECTOMY     TOOTH EXTRACTION      Physical Exam   Triage Vital Signs: ED Triage Vitals  Encounter Vitals Group     BP 06/26/24 0925 (!) 145/72     Girls Systolic BP Percentile --      Girls Diastolic BP Percentile --      Boys Systolic BP Percentile --      Boys Diastolic BP Percentile --      Pulse Rate 06/26/24 0925 98     Resp 06/26/24 0925 18     Temp 06/26/24 0925 (!) 97.5 F (36.4 C)     Temp Source 06/26/24 0925 Oral     SpO2 06/26/24 0925 99 %     Weight --      Height --      Head Circumference --      Peak Flow --      Pain Score 06/26/24 0928 7     Pain Loc --      Pain Education --      Exclude from Growth Chart --     Most recent vital signs: Vitals:   06/26/24 0925 06/26/24 1130  BP: (!) 145/72 (!) 152/73  Pulse: 98 74  Resp: 18   Temp: (!) 97.5 F (36.4 C)   SpO2: 99% 100%    General: Awake, no distress.  CV:  Good peripheral perfusion.  Resp:  Normal effort.  Abd:  No distention.  Soft with right-sided tenderness.  No CVA tenderness.   There is tenderness in the left low back musculature. Other:  Moist oral mucosa   ED Results / Procedures / Treatments   Labs (all labs ordered are listed, but only abnormal results are displayed) Labs Reviewed  LIPASE, BLOOD - Abnormal; Notable for the following components:      Result Value   Lipase 53 (*)    All other components within normal limits  COMPREHENSIVE METABOLIC PANEL WITH GFR - Abnormal; Notable for the following components:   Creatinine, Ser 1.21 (*)    GFR, Estimated 51 (*)    All other components within normal limits  URINALYSIS, ROUTINE W REFLEX MICROSCOPIC - Abnormal; Notable for the following components:   Color, Urine STRAW (*)    APPearance CLEAR (*)    Hgb urine dipstick SMALL (*)    All other components within normal limits  CBC     EKG    RADIOLOGY CT abdomen  pelvis pending   PROCEDURES:  Procedures   MEDICATIONS ORDERED IN ED: Medications  sodium chloride  0.9 % bolus 1,000 mL (1,000 mLs Intravenous New Bag/Given 06/26/24 1146)  iohexol  (OMNIPAQUE ) 300 MG/ML solution 100 mL (100 mLs Intravenous Contrast Given 06/26/24 1247)     IMPRESSION / MDM / ASSESSMENT AND PLAN / ED COURSE  I reviewed the triage vital signs and the nursing notes.  DDx: Appendicitis, UTI, ureterolithiasis, cholecystitis, functional pain, ovarian cyst.  Doubt torsion, aneurysm, dissection  Patient's presentation is most consistent with acute presentation with potential threat to life or bodily function.     Clinical Course as of 06/26/24 1347  Thu Jun 26, 2024  1113 Patient complains of right lower back pain radiating around to the front and into her thigh.  Has a sensation of being unable to empty her bladder.  Abdominal exam shows some right lower quadrant and right upper quadrant tenderness.  Will obtain CT to evaluate for obstructing stone versus appendicitis. [PS]    Clinical Course User Index [PS] Viviann Pastor, MD     ----------------------------------------- 1:46 PM on 06/26/2024 ----------------------------------------- CT and labs reassuring.  No evidence of UTI.  Has cholelithiasis which is chronic and known to the patient.  Denies symptoms of biliary disease, negative Murphy sign on exam.  Based on temporal relationship I suspect symptoms are a side effect of Valtrex which she discontinued 2 days ago.  She will continue to hydrate, monitor symptoms, follow-up with primary care and urology if not resolved over the next 3 days.   FINAL CLINICAL IMPRESSION(S) / ED DIAGNOSES   Final diagnoses:  Bilateral low back pain without sciatica, unspecified chronicity  Urinary hesitancy  Calculus of gallbladder without cholecystitis without obstruction     Rx / DC Orders   ED Discharge Orders     None        Note:  This document was prepared using Dragon voice recognition software and may include unintentional dictation errors.   Viviann Pastor, MD 06/26/24 [phone]

## 2024-06-26 NOTE — ED Notes (Signed)
 EDP at bedside

## 2024-07-16 ENCOUNTER — Other Ambulatory Visit: Payer: Self-pay | Admitting: *Deleted

## 2024-07-16 DIAGNOSIS — D472 Monoclonal gammopathy: Secondary | ICD-10-CM

## 2024-07-17 ENCOUNTER — Inpatient Hospital Stay: Attending: Oncology

## 2024-07-17 DIAGNOSIS — D472 Monoclonal gammopathy: Secondary | ICD-10-CM | POA: Diagnosis present

## 2024-07-17 LAB — CBC WITH DIFFERENTIAL/PLATELET
Abs Immature Granulocytes: 0.02 K/uL (ref 0.00–0.07)
Basophils Absolute: 0.1 K/uL (ref 0.0–0.1)
Basophils Relative: 1 %
Eosinophils Absolute: 0.1 K/uL (ref 0.0–0.5)
Eosinophils Relative: 1 %
HCT: 37.5 % (ref 36.0–46.0)
Hemoglobin: 12.9 g/dL (ref 12.0–15.0)
Immature Granulocytes: 0 %
Lymphocytes Relative: 45 %
Lymphs Abs: 3.6 K/uL (ref 0.7–4.0)
MCH: 28.2 pg (ref 26.0–34.0)
MCHC: 34.4 g/dL (ref 30.0–36.0)
MCV: 82.1 fL (ref 80.0–100.0)
Monocytes Absolute: 0.6 K/uL (ref 0.1–1.0)
Monocytes Relative: 8 %
Neutro Abs: 3.6 K/uL (ref 1.7–7.7)
Neutrophils Relative %: 45 %
Platelets: 173 K/uL (ref 150–400)
RBC: 4.57 MIL/uL (ref 3.87–5.11)
RDW: 14.2 % (ref 11.5–15.5)
Smear Review: NORMAL
WBC: 8 K/uL (ref 4.0–10.5)
nRBC: 0 % (ref 0.0–0.2)

## 2024-07-17 LAB — BASIC METABOLIC PANEL - CANCER CENTER ONLY
Anion gap: 8 (ref 5–15)
BUN: 24 mg/dL — ABNORMAL HIGH (ref 8–23)
CO2: 26 mmol/L (ref 22–32)
Calcium: 8.9 mg/dL (ref 8.9–10.3)
Chloride: 103 mmol/L (ref 98–111)
Creatinine: 1.21 mg/dL — ABNORMAL HIGH (ref 0.44–1.00)
GFR, Estimated: 51 mL/min — ABNORMAL LOW (ref >60)
Glucose, Bld: 107 mg/dL — ABNORMAL HIGH (ref 70–99)
Potassium: 3.9 mmol/L (ref 3.5–5.1)
Sodium: 137 mmol/L (ref 135–145)

## 2024-07-18 LAB — KAPPA/LAMBDA LIGHT CHAINS
Kappa free light chain: 26.5 mg/L — ABNORMAL HIGH (ref 3.3–19.4)
Kappa, lambda light chain ratio: 0.99 (ref 0.26–1.65)
Lambda free light chains: 26.7 mg/L — ABNORMAL HIGH (ref 5.7–26.3)

## 2024-07-18 LAB — IGG, IGA, IGM
IgA: 51 mg/dL — ABNORMAL LOW (ref 87–352)
IgG (Immunoglobin G), Serum: 619 mg/dL (ref 586–1602)
IgM (Immunoglobulin M), Srm: 1231 mg/dL — ABNORMAL HIGH (ref 26–217)

## 2024-07-21 LAB — PROTEIN ELECTROPHORESIS, SERUM
A/G Ratio: 1.1 (ref 0.7–1.7)
Albumin ELP: 3.5 g/dL (ref 2.9–4.4)
Alpha-1-Globulin: 0.2 g/dL (ref 0.0–0.4)
Alpha-2-Globulin: 0.7 g/dL (ref 0.4–1.0)
Beta Globulin: 0.8 g/dL (ref 0.7–1.3)
Gamma Globulin: 1.4 g/dL (ref 0.4–1.8)
Globulin, Total: 3.1 g/dL (ref 2.2–3.9)
Total Protein ELP: 6.6 g/dL (ref 6.0–8.5)

## 2024-07-24 ENCOUNTER — Inpatient Hospital Stay: Attending: Oncology | Admitting: Oncology

## 2024-07-24 ENCOUNTER — Encounter: Payer: Self-pay | Admitting: Oncology

## 2024-07-24 VITALS — BP 122/68 | HR 86 | Temp 97.6°F | Resp 18 | Ht 64.0 in | Wt 129.0 lb

## 2024-07-24 DIAGNOSIS — G8929 Other chronic pain: Secondary | ICD-10-CM | POA: Insufficient documentation

## 2024-07-24 DIAGNOSIS — D472 Monoclonal gammopathy: Secondary | ICD-10-CM | POA: Insufficient documentation

## 2024-07-24 DIAGNOSIS — R6884 Jaw pain: Secondary | ICD-10-CM | POA: Insufficient documentation

## 2024-07-24 NOTE — Progress Notes (Signed)
 Patient is still having the upper right jaw pain and discomfort, but she told me that she is just learning to live with it, so she is doing a little bit better than she was last visit with us . She says that she is having kidney trouble, along with UTI symptoms sometimes.

## 2024-07-24 NOTE — Progress Notes (Signed)
 [facility]  [phone] Fax:([phone]) [phone]  ID: Jamie Walker OB: [birth year]  MR#: [id number]  RDW#:[id number]  Patient Care Team: Marikay Eva POUR, PA as PCP - General (Physician Assistant) Jacobo Evalene PARAS, MD as Consulting Physician (Oncology)  CHIEF COMPLAINT: MGUS.  INTERVAL HISTORY: Patient returns to clinic today for repeat laboratory and routine 55-month evaluation.  She continues to have persistent right jaw pain, but does admit it is mildly better and she is learning to live with it.  She otherwise feels well.  She has no neurologic complaints.  She denies any recent fevers or illnesses.  She has a fair appetite, but denies weight loss.  She denies any other pain.  She has no chest pain, shortness of breath, cough, or hemoptysis.  She denies any nausea, vomiting, constipation, or diarrhea.  She has no urinary complaints.  Patient offers no further specific complaints today.  REVIEW OF SYSTEMS:   Review of Systems  Constitutional: Negative.  Negative for fever, malaise/fatigue and weight loss.  HENT:         Jaw pain.  Respiratory: Negative.  Negative for cough, hemoptysis and shortness of breath.   Cardiovascular: Negative.  Negative for chest pain and leg swelling.  Gastrointestinal: Negative.  Negative for abdominal pain.  Genitourinary: Negative.  Negative for dysuria.  Musculoskeletal: Negative.  Negative for back pain and neck pain.  Skin: Negative.  Negative for rash.  Neurological: Negative.  Negative for dizziness, focal weakness, weakness and headaches.  Psychiatric/Behavioral: Negative.  The patient is not nervous/anxious.     As per HPI. Otherwise, a complete review of systems is negative.  PAST MEDICAL HISTORY: Past Medical History:  Diagnosis Date   Allergy    Arthritis    Chronic kidney disease    Thyroid disease     PAST SURGICAL HISTORY: Past Surgical History:  Procedure Laterality Date   THYROIDECTOMY      TOOTH EXTRACTION      FAMILY HISTORY: Family History  Problem Relation Age of Onset   Cancer Father    Heart disease Father     ADVANCED DIRECTIVES (Y/N):  N  HEALTH MAINTENANCE: Social History   Tobacco Use   Smoking status: Never   Smokeless tobacco: Never  Substance Use Topics   Alcohol use: Never   Drug use: Never     Colonoscopy:  PAP:  Bone density:  Lipid panel:  Allergies  Allergen Reactions   Articaine-Epinephrine Palpitations    SEPTOCAINE   Wasp Venom Anaphylaxis   Wasp Venom Protein Other (See Comments)   Amoxicillin-Pot Clavulanate Other (See Comments)    Bladder problems   Avocado Other (See Comments)   Cephalosporins    Morphine  Nausea And Vomiting    Severe stomach cramping   Piper Other (See Comments)    Pepper, Bell peppers    Current Outpatient Medications  Medication Sig Dispense Refill   Cholecalciferol (VITAMIN D-1000 MAX ST) 25 MCG (1000 UT) tablet Take 1,000 Units by mouth daily.     EPINEPHrine 0.3 mg/0.3 mL IJ SOAJ injection Inject 0.3 mLs into the muscle as needed.     ibuprofen (ADVIL) 100 MG tablet Take 100 mg by mouth every 6 (six) hours as needed for pain. As needed     SYNTHROID 75 MCG tablet      No current facility-administered medications for this visit.    OBJECTIVE: Vitals:   07/24/24 1411  BP: 122/68  Pulse: 86  Resp: 18  Temp: 97.6 F (36.4 C)  SpO2: 100%      Body mass index is 22.14 kg/m.    ECOG FS:1 - Symptomatic but completely ambulatory  General: Well-developed, well-nourished, no acute distress. Eyes: Pink conjunctiva, anicteric sclera. HEENT: Normocephalic, moist mucous membranes. Lungs: No audible wheezing or coughing. Heart: Regular rate and rhythm. Abdomen: Soft, nontender, no obvious distention. Musculoskeletal: No edema, cyanosis, or clubbing. Neuro: Alert, answering all questions appropriately. Cranial nerves grossly intact. Skin: No rashes or petechiae noted. Psych: Normal affect.  LAB  RESULTS:  Lab Results  Component Value Date   NA 137 07/17/2024   K 3.9 07/17/2024   CL 103 07/17/2024   CO2 26 07/17/2024   GLUCOSE 107 (H) 07/17/2024   BUN 24 (H) 07/17/2024   CREATININE 1.21 (H) 07/17/2024   CALCIUM 8.9 07/17/2024   PROT 7.0 06/26/2024   ALBUMIN 3.7 06/26/2024   AST 18 06/26/2024   ALT 14 06/26/2024   ALKPHOS 70 06/26/2024   BILITOT 0.9 06/26/2024   GFRNONAA 51 (L) 07/17/2024   GFRAA >60 01/09/2019    Lab Results  Component Value Date   WBC 8.0 07/17/2024   NEUTROABS 3.6 07/17/2024   HGB 12.9 07/17/2024   HCT 37.5 07/17/2024   MCV 82.1 07/17/2024   PLT 173 07/17/2024     STUDIES: CT ABDOMEN PELVIS W CONTRAST Result Date: 06/26/2024 CLINICAL DATA:  Right lower quadrant abdominal pain EXAM: CT ABDOMEN AND PELVIS WITH CONTRAST TECHNIQUE: Multidetector CT imaging of the abdomen and pelvis was performed using the standard protocol following bolus administration of intravenous contrast. RADIATION DOSE REDUCTION: This exam was performed according to the departmental dose-optimization program which includes automated exposure control, adjustment of the mA and/or kV according to patient size and/or use of iterative reconstruction technique. CONTRAST:  OMNIPAQUE  IOHEXOL  300 MG/ML  SOLN COMPARISON:  None Available. FINDINGS: Lower chest: No acute findings Hepatobiliary: Numerous gallstones within the gallbladder. No evidence of acute cholecystitis or biliary ductal dilatation. 1 cm cyst centrally in the right hepatic lobe. Portal vein is prominent with a maximum diameter of 16 mm. Pancreas: No focal abnormality or ductal dilatation. Spleen: Splenomegaly with a craniocaudal length of 13 cm. No focal abnormality. Adrenals/Urinary Tract: No adrenal abnormality. No focal renal abnormality. No stones or hydronephrosis. Urinary bladder is unremarkable. Stomach/Bowel: Stomach, large and small bowel grossly unremarkable. Normal appendix. Vascular/Lymphatic: No evidence of  aneurysm or adenopathy. Reproductive: Uterus and adnexa unremarkable.  No mass. Other: Small amount of free fluid in the pelvis.  No free air. Musculoskeletal: No acute bony abnormality. IMPRESSION: While there are no clear morphologic changes of cirrhosis, there appears to be portal venous hypertension with dilated portal vein and splenomegaly. Free fluid in the pelvis could also be related to portal venous hypertension. Cholelithiasis.  No sonographic evidence of acute cholecystitis. Electronically Signed   By: Franky Crease M.D.   On: 06/26/2024 13:01    ASSESSMENT: MGUS.  PLAN:    Elevated IgM: Chronic and unchanged.  Patient's IgM level has ranged between 1095 and 1231 since March 2025.  Both kappa and lambda light chains are mildly elevated, but her light chain ratio is within normal limits.  She does not have an M spike on SPEP.  Patient also noted to have a decreased IgA.  Pathology from patient's jaw did not indicate any malignancy.  Imaging not consistent with underlying myeloma.  She has no evidence of endorgan damage with a normal CBC, normal calcium level and only mildly elevated creatinine at 1.21.  She does not  require imaging or bone marrow biopsy at this time.  Return to clinic in 6 months with repeat laboratory work and further evaluation.   Jaw pain: Chronic and unchanged.  Patient reports she has been to multiple subspecialists with no solution or obvious etiology of her persistent pain.  Referral to ENT was declined, therefore patient was given a referral to [facility] for further evaluation.  Patient was also given a referral to pain clinic, but she has already seen them several times and reports they are unable to help.  She has also seen oral maxillofacial surgery at [facility].  Patient states her symptoms initiated when she contracted COVID and may consider the long COVID clinic at [facility].  I spent a total of 20 minutes reviewing chart data, face-to-face evaluation with the patient, counseling  and coordination of care as detailed above.    Patient expressed understanding and was in agreement with this plan. She also understands that She can call clinic at any time with any questions, concerns, or complaints.    Evalene JINNY Reusing, MD   07/24/2024 2:15 PM    Check

## 2025-01-28 ENCOUNTER — Other Ambulatory Visit

## 2025-02-04 ENCOUNTER — Ambulatory Visit: Admitting: Oncology
